# Patient Record
Sex: Female | Born: 1935 | Race: White | Hispanic: No | Smoking: Never smoker
Health system: Southern US, Community
[De-identification: ages and names within clinical notes are randomized; demographics above are authoritative.]

## PROBLEM LIST (undated history)

## (undated) DIAGNOSIS — E785 Hyperlipidemia, unspecified: Secondary | ICD-10-CM

## (undated) DIAGNOSIS — I712 Thoracic aortic aneurysm, without rupture, unspecified: Secondary | ICD-10-CM

## (undated) DIAGNOSIS — I4891 Unspecified atrial fibrillation: Secondary | ICD-10-CM

## (undated) DIAGNOSIS — K635 Polyp of colon: Secondary | ICD-10-CM

## (undated) DIAGNOSIS — E079 Disorder of thyroid, unspecified: Secondary | ICD-10-CM

## (undated) DIAGNOSIS — M1990 Unspecified osteoarthritis, unspecified site: Secondary | ICD-10-CM

## (undated) DIAGNOSIS — I2510 Atherosclerotic heart disease of native coronary artery without angina pectoris: Secondary | ICD-10-CM

## (undated) DIAGNOSIS — M810 Age-related osteoporosis without current pathological fracture: Secondary | ICD-10-CM

## (undated) DIAGNOSIS — N200 Calculus of kidney: Secondary | ICD-10-CM

## (undated) DIAGNOSIS — K279 Peptic ulcer, site unspecified, unspecified as acute or chronic, without hemorrhage or perforation: Secondary | ICD-10-CM

## (undated) HISTORY — DX: Thoracic aortic aneurysm, without rupture, unspecified: I71.20

## (undated) HISTORY — DX: Calculus of kidney: N20.0

## (undated) HISTORY — DX: Hyperlipidemia, unspecified: E78.5

## (undated) HISTORY — PX: CARDIAC VALVE REPLACEMENT: SHX585

## (undated) HISTORY — PX: RENAL ARTERY STENT: SHX2321

## (undated) HISTORY — DX: Polyp of colon: K63.5

## (undated) HISTORY — DX: Age-related osteoporosis without current pathological fracture: M81.0

## (undated) HISTORY — DX: Thoracic aortic aneurysm, without rupture: I71.2

---

## 2003-03-29 HISTORY — PX: AORTIC VALVE REPLACEMENT: SHX41

## 2003-03-29 HISTORY — PX: CORONARY ARTERY BYPASS GRAFT: SHX141

## 2003-04-23 HISTORY — PX: FEMORAL ARTERY STENT: SHX1583

## 2012-02-22 ENCOUNTER — Other Ambulatory Visit: Payer: Self-pay | Admitting: Internal Medicine

## 2012-02-22 DIAGNOSIS — R109 Unspecified abdominal pain: Secondary | ICD-10-CM

## 2012-02-24 ENCOUNTER — Other Ambulatory Visit: Payer: Self-pay | Admitting: Internal Medicine

## 2012-02-24 DIAGNOSIS — N6019 Diffuse cystic mastopathy of unspecified breast: Secondary | ICD-10-CM

## 2012-02-25 ENCOUNTER — Ambulatory Visit
Admission: RE | Admit: 2012-02-25 | Discharge: 2012-02-25 | Disposition: A | Discharge status: Home / Self Care | Payer: Medicare Other | Source: Ambulatory Visit | Attending: Internal Medicine | Admitting: Internal Medicine

## 2012-02-25 DIAGNOSIS — R109 Unspecified abdominal pain: Secondary | ICD-10-CM

## 2012-04-13 ENCOUNTER — Ambulatory Visit
Admission: RE | Admit: 2012-04-13 | Discharge: 2012-04-13 | Disposition: A | Discharge status: Home / Self Care | Payer: Medicare Other | Source: Ambulatory Visit | Attending: Internal Medicine | Admitting: Internal Medicine

## 2012-04-13 ENCOUNTER — Other Ambulatory Visit: Payer: Self-pay | Admitting: Internal Medicine

## 2012-04-13 DIAGNOSIS — N6019 Diffuse cystic mastopathy of unspecified breast: Secondary | ICD-10-CM

## 2012-04-28 ENCOUNTER — Encounter (HOSPITAL_COMMUNITY): Payer: Self-pay | Admitting: Emergency Medicine

## 2012-04-28 ENCOUNTER — Emergency Department (HOSPITAL_COMMUNITY)
Admission: EM | Admit: 2012-04-28 | Discharge: 2012-04-28 | Disposition: A | Discharge status: Home / Self Care | Payer: Medicare Other | Attending: Emergency Medicine | Admitting: Emergency Medicine

## 2012-04-28 ENCOUNTER — Emergency Department (HOSPITAL_COMMUNITY): Payer: Medicare Other

## 2012-04-28 DIAGNOSIS — I4891 Unspecified atrial fibrillation: Secondary | ICD-10-CM | POA: Insufficient documentation

## 2012-04-28 DIAGNOSIS — S0101XA Laceration without foreign body of scalp, initial encounter: Secondary | ICD-10-CM

## 2012-04-28 DIAGNOSIS — Z8739 Personal history of other diseases of the musculoskeletal system and connective tissue: Secondary | ICD-10-CM | POA: Insufficient documentation

## 2012-04-28 DIAGNOSIS — S0990XA Unspecified injury of head, initial encounter: Secondary | ICD-10-CM

## 2012-04-28 DIAGNOSIS — Z7982 Long term (current) use of aspirin: Secondary | ICD-10-CM | POA: Insufficient documentation

## 2012-04-28 DIAGNOSIS — Z79899 Other long term (current) drug therapy: Secondary | ICD-10-CM | POA: Insufficient documentation

## 2012-04-28 DIAGNOSIS — I2510 Atherosclerotic heart disease of native coronary artery without angina pectoris: Secondary | ICD-10-CM | POA: Insufficient documentation

## 2012-04-28 DIAGNOSIS — Y93E5 Activity, floor mopping and cleaning: Secondary | ICD-10-CM | POA: Insufficient documentation

## 2012-04-28 DIAGNOSIS — Z8711 Personal history of peptic ulcer disease: Secondary | ICD-10-CM | POA: Insufficient documentation

## 2012-04-28 DIAGNOSIS — E039 Hypothyroidism, unspecified: Secondary | ICD-10-CM | POA: Insufficient documentation

## 2012-04-28 DIAGNOSIS — Z7901 Long term (current) use of anticoagulants: Secondary | ICD-10-CM | POA: Insufficient documentation

## 2012-04-28 DIAGNOSIS — Y92009 Unspecified place in unspecified non-institutional (private) residence as the place of occurrence of the external cause: Secondary | ICD-10-CM | POA: Insufficient documentation

## 2012-04-28 DIAGNOSIS — W2209XA Striking against other stationary object, initial encounter: Secondary | ICD-10-CM | POA: Insufficient documentation

## 2012-04-28 DIAGNOSIS — S0100XA Unspecified open wound of scalp, initial encounter: Secondary | ICD-10-CM | POA: Insufficient documentation

## 2012-04-28 HISTORY — DX: Unspecified osteoarthritis, unspecified site: M19.90

## 2012-04-28 HISTORY — DX: Peptic ulcer, site unspecified, unspecified as acute or chronic, without hemorrhage or perforation: K27.9

## 2012-04-28 HISTORY — DX: Disorder of thyroid, unspecified: E07.9

## 2012-04-28 HISTORY — DX: Unspecified atrial fibrillation: I48.91

## 2012-04-28 HISTORY — DX: Atherosclerotic heart disease of native coronary artery without angina pectoris: I25.10

## 2012-04-28 LAB — CBC
HCT: 39.9 % (ref 36.0–46.0)
Hemoglobin: 13.4 g/dL (ref 12.0–15.0)
MCHC: 33.6 g/dL (ref 30.0–36.0)
RBC: 4.38 MIL/uL (ref 3.87–5.11)

## 2012-04-28 LAB — PROTIME-INR: INR: 1.99 — ABNORMAL HIGH (ref 0.00–1.49)

## 2012-04-28 NOTE — ED Provider Notes (Signed)
History     CSN: [id number]  Arrival date & time 04/28/12  1256   First MD Initiated Contact with Patient 04/28/12 1300      Chief Complaint  Patient presents with  . Head Laceration    (Consider location/radiation/quality/duration/timing/severity/associated sxs/prior treatment) HPI Pt reports he was dusting in her kitchen earlier today when she stood up and hit her head on the kitchen counter. She reports she was bleeding but denies any LOC. Minimal pain. She has history of atrial fibrillation and is on Coumadin. No bleeding at present. She denies any other injuries.   Past Medical History  Diagnosis Date  . Atrial fibrillation   . PUD (peptic ulcer disease)   . Coronary artery disease   . Thyroid disease     Hypothyroidism  . DJD (degenerative joint disease)   . DJD (degenerative joint disease)     No past surgical history on file.  History reviewed. No pertinent family history.  History  Substance Use Topics  . Smoking status: Unknown If Ever Smoked  . Smokeless tobacco: Not on file  . Alcohol Use: No    OB History    Grav Para Term Preterm Abortions TAB SAB Ect Mult Living                  Review of Systems All other systems reviewed and are negative except as noted in HPI.   Allergies  Review of patient's allergies indicates not on file.  Home Medications  No current outpatient prescriptions on file.  BP 139/76  Pulse 45  Temp 97.7 F (36.5 C) (Oral)  SpO2 98%  Physical Exam  Nursing note and vitals reviewed. Constitutional: She is oriented to person, place, and time. She appears well-developed and well-nourished.  HENT:  Head: Normocephalic.       1cm laceration to L frontal scalp  Eyes: EOM are normal. Pupils are equal, round, and reactive to light.  Neck: Normal range of motion. Neck supple.  Cardiovascular: Normal rate, normal heart sounds and intact distal pulses.   Pulmonary/Chest: Effort normal and breath sounds normal.  Abdominal:  Bowel sounds are normal. She exhibits no distension. There is no tenderness.  Musculoskeletal: Normal range of motion. She exhibits no edema and no tenderness.  Neurological: She is alert and oriented to person, place, and time. She has normal strength. No cranial nerve deficit or sensory deficit.  Skin: Skin is warm and dry. No rash noted.  Psychiatric: She has a normal mood and affect.    ED Course  Procedures (including critical care time)  Labs Reviewed  PROTIME-INR - Abnormal; Notable for the following:    Prothrombin Time 21.8 (*)     INR 1.99 (*)     All other components within normal limits  CBC   Ct Head Wo Contrast  04/28/2012  *RADIOLOGY REPORT*  Clinical Data: Head injury.  On Coumadin  CT HEAD WITHOUT CONTRAST  Technique:  Contiguous axial images were obtained from the base of the skull through the vertex without contrast.  Comparison: None  Findings: Age appropriate atrophy.  Negative for acute infarct. Mild chronic microvascular ischemia in the white matter.  Negative for intracranial hemorrhage.  Negative for mass or edema.  Negative for skull fracture.  IMPRESSION: No acute abnormality.   Original Report Authenticated By: Janeece Riggers, M.D.      No diagnosis found.    MDM  LACERATION REPAIR Performed by: Pollyann Savoy. Consent: Verbal consent obtained. Risks and benefits: risks, benefits  and alternatives were discussed Patient identity confirmed: provided demographic data Time out performed prior to procedure Prepped and Draped in normal sterile fashion Wound explored Laceration Location: L frontal scalp Laceration Length: 1cm No Foreign Bodies seen or palpated Irrigation method: syringe Amount of cleaning: standard Skin closure: staple Number of sutures or staples: 1 Technique: staple Patient tolerance: Patient tolerated the procedure well with no immediate complications.        Charles B. Bernette Mayers, MD 04/28/12 1445

## 2012-04-28 NOTE — ED Notes (Signed)
Dr Sheldon at bedside  

## 2012-04-28 NOTE — ED Notes (Addendum)
Per EMS pt was in her independent living apt and was cleaning, she bent over to pick something up and when she stood up she hit her head on the corner of the counter top. Pt has a cut to the front top of head, visible hematoma.  Pt is on coumadin. Bleeding is controlled. Denies any pain. Daughter at bedside. Vital signs stable.

## 2012-04-28 NOTE — ED Notes (Signed)
Discharge instructions reviewed with pt and daughter. Both verbalized understanding.

## 2012-04-28 NOTE — ED Notes (Signed)
Pt transported to CT ?

## 2012-05-19 ENCOUNTER — Other Ambulatory Visit: Payer: Self-pay | Admitting: *Deleted

## 2012-05-19 ENCOUNTER — Encounter: Payer: Self-pay | Admitting: Vascular Surgery

## 2012-05-19 DIAGNOSIS — I739 Peripheral vascular disease, unspecified: Secondary | ICD-10-CM

## 2012-06-16 ENCOUNTER — Encounter: Payer: Medicare Other | Admitting: Vascular Surgery

## 2012-07-18 ENCOUNTER — Encounter: Payer: Self-pay | Admitting: Vascular Surgery

## 2012-07-19 ENCOUNTER — Ambulatory Visit (INDEPENDENT_AMBULATORY_CARE_PROVIDER_SITE_OTHER): Payer: Medicare Other | Admitting: Vascular Surgery

## 2012-07-19 ENCOUNTER — Encounter (INDEPENDENT_AMBULATORY_CARE_PROVIDER_SITE_OTHER): Payer: Medicare Other | Admitting: *Deleted

## 2012-07-19 ENCOUNTER — Encounter: Payer: Self-pay | Admitting: Vascular Surgery

## 2012-07-19 ENCOUNTER — Encounter: Payer: Medicare Other | Admitting: *Deleted

## 2012-07-19 VITALS — BP 101/72 | HR 48 | Wt 112.1 lb

## 2012-07-19 DIAGNOSIS — I739 Peripheral vascular disease, unspecified: Secondary | ICD-10-CM

## 2012-07-19 DIAGNOSIS — Z48812 Encounter for surgical aftercare following surgery on the circulatory system: Secondary | ICD-10-CM

## 2012-07-19 NOTE — Addendum Note (Signed)
Addended by: Adria Dill L on: 07/19/2012 03:48 PM   Modules accepted: Orders

## 2012-07-19 NOTE — Progress Notes (Signed)
[facility] [place] HISTORY AND PHYSICAL   History of Present Illness:  Patient is a 79 y.o. year old female who presents for evaluation of bilateral LE.  Other medical problems include Right LE occlusions with collateral flow, and Left LE SFA stent with multiple angioplasty procedures.  Her primary care doctor is Nila Nephew.  She has just moved from [place].  Past Medical History  Diagnosis Date  . Atrial fibrillation   . PUD (peptic ulcer disease)   . Coronary artery disease   . Thyroid disease     Hypothyroidism  . DJD (degenerative joint disease)   . DJD (degenerative joint disease)   . Hyperlipidemia   . Osteoporosis   . Thoracic aortic aneurysm   . Colon polyps   . Kidney stone     Past Surgical History  Procedure Laterality Date  . Cardiac valve replacement      aortic valve  . Renal artery stent    . Aortic valve replacement  03/29/2003  . Coronary artery bypass graft  03/29/2003  . Femoral artery stent Left 04/2003     Social History History  Substance Use Topics  . Smoking status: Never Smoker   . Smokeless tobacco: Never Used  . Alcohol Use: No    Family History Family History  Problem Relation Age of Onset  . Heart disease Mother   . Hypertension Mother   . Heart disease Father   . Hypertension Father   . Heart attack Father   . Heart disease Brother   . Hypertension Brother   . Diabetes Daughter     Allergies  Allergies  Allergen Reactions  . Shrimp (Shellfish Allergy) Hives     Current Outpatient Prescriptions  Medication Sig Dispense Refill  . aspirin 81 MG tablet Take 81 mg by mouth every other day.      Marland Kitchen atorvastatin (LIPITOR) 40 MG tablet Take 40 mg by mouth daily.      Marland Kitchen levothyroxine (SYNTHROID, LEVOTHROID) 88 MCG tablet Take 88 mcg by mouth daily.      Marland Kitchen SOTALOL AF 80 MG TABS Take by mouth 2 (two) times daily. Take 1/2 tablet bid      . temazepam (RESTORIL) 15 MG capsule Take 15 mg by mouth at bedtime.       Marland Kitchen warfarin (COUMADIN) 2.5 MG tablet Take 2.5 mg by mouth See admin instructions. Takes 5 mg every day except Tuesday and Saturdays      . warfarin (COUMADIN) 5 MG tablet Take 5 mg by mouth 2 (two) times a week. On Tuesdays and Saturdays       No current facility-administered medications for this visit.    ROS:   General:  No weight loss, Fever, chills  HEENT: No recent headaches, no nasal bleeding, no visual changes, no sore throat  Neurologic: No dizziness, blackouts, seizures. No recent symptoms of stroke or mini- stroke. No recent episodes of slurred speech, or temporary blindness.  Cardiac: No recent episodes of chest pain/pressure, no shortness of breath at rest.  No shortness of breath with exertion.  Denies history of atrial fibrillation or irregular heartbeat History of MI and carotid stenting.  Vascular: No history of rest pain in feet.  No history of claudication.  Positive history of non-healing ulcer, No history of DVT   Pulmonary: No home oxygen, no productive cough, no hemoptysis,  No asthma or wheezing  Musculoskeletal:  [ ]  Arthritis, [x ] Low back pain,  [ ]  Joint pain  Kyphoplasty with multiple compression fractures.  Hematologic:No history of hypercoagulable state.  No history of easy bleeding.  No history of anemia  Gastrointestinal: No hematochezia or melena,  No gastroesophageal reflux, no trouble swallowing  Urinary: [ ]  chronic Kidney disease, [ ]  on HD - [ ]  MWF or [ ]  TTHS, [ ]  Burning with urination, [ ]  Frequent urination, [ ]  Difficulty urinating; Kidney stones with history of hydronephrosis stenting.  Skin: No rashes  Darken skin L>R anterior shins  Psychological: No history of anxiety,  No history of depression   Physical Examination  Filed Vitals:   07/19/12 1353  BP: 101/72  Pulse: 48  Weight: 112 lb 1.6 oz (50.848 kg)  SpO2: 97%    There is no height on file to calculate BMI.  General:  Alert and oriented, no acute distress HEENT:  Normal Neck: No bruit or JVD Pulmonary: Clear to auscultation bilaterally Cardiac: Regular Rate and Rhythm without murmur Abdomen: Soft, non-tender, non-distended, no mass Skin: No rash Extremity Pulses:  2+ radial, brachial, femoral, NON palpable dorsalis pedis, posterior tibial pulses bilaterally Musculoskeletal: No deformity or edema  Neurologic: Upper and lower extremity motor 5/5 and symmetric  DATA: History and exam from Dr. Amanda Cockayne office as well as post surgery follow up note from Dr. Shirlyn Goltz in [place] post angioplasty left LE   ASSESSMENT:  Chronic LE arterial stenosis right greater than left.   She is currently asymptomatic without complaints of claudication with activity or at rest.   PLAN: Follow up in 1 year for repeat ABI's If symptoms occur call sooner.  Thomasena Edis, EMMA [facility] PA-C [facility] [place] Office: [phone]  Agree with above. I'll see her back in one year with follow up ABIs. She knows to call sooner if she has problems.  Waverly Ferrari, MD, FACS [employer] [phone] 07/19/2012

## 2012-09-15 ENCOUNTER — Other Ambulatory Visit: Payer: Self-pay | Admitting: Internal Medicine

## 2012-09-15 ENCOUNTER — Ambulatory Visit
Admission: RE | Admit: 2012-09-15 | Discharge: 2012-09-15 | Disposition: A | Discharge status: Home / Self Care | Payer: Medicare Other | Source: Ambulatory Visit | Attending: Internal Medicine | Admitting: Internal Medicine

## 2012-09-15 DIAGNOSIS — B999 Unspecified infectious disease: Secondary | ICD-10-CM

## 2012-09-18 ENCOUNTER — Other Ambulatory Visit: Payer: Self-pay | Admitting: Internal Medicine

## 2012-09-18 DIAGNOSIS — M810 Age-related osteoporosis without current pathological fracture: Secondary | ICD-10-CM

## 2012-10-06 ENCOUNTER — Ambulatory Visit
Admission: RE | Admit: 2012-10-06 | Discharge: 2012-10-06 | Disposition: A | Discharge status: Home / Self Care | Payer: Medicare Other | Source: Ambulatory Visit | Attending: Internal Medicine | Admitting: Internal Medicine

## 2012-10-06 DIAGNOSIS — M810 Age-related osteoporosis without current pathological fracture: Secondary | ICD-10-CM

## 2013-01-26 ENCOUNTER — Other Ambulatory Visit: Payer: Self-pay | Admitting: Internal Medicine

## 2013-01-26 DIAGNOSIS — R351 Nocturia: Secondary | ICD-10-CM

## 2013-01-30 ENCOUNTER — Other Ambulatory Visit: Payer: Medicare Other

## 2013-01-31 ENCOUNTER — Ambulatory Visit
Admission: RE | Admit: 2013-01-31 | Discharge: 2013-01-31 | Disposition: A | Discharge status: Home / Self Care | Payer: Medicare Other | Source: Ambulatory Visit | Attending: Internal Medicine | Admitting: Internal Medicine

## 2013-01-31 ENCOUNTER — Other Ambulatory Visit: Payer: Self-pay | Admitting: Internal Medicine

## 2013-01-31 DIAGNOSIS — R351 Nocturia: Secondary | ICD-10-CM

## 2013-03-29 ENCOUNTER — Other Ambulatory Visit: Payer: Self-pay

## 2013-03-29 DIAGNOSIS — Z1231 Encounter for screening mammogram for malignant neoplasm of breast: Secondary | ICD-10-CM

## 2013-04-20 ENCOUNTER — Ambulatory Visit
Admission: RE | Admit: 2013-04-20 | Discharge: 2013-04-20 | Disposition: A | Discharge status: Home / Self Care | Payer: Medicare Other | Source: Ambulatory Visit

## 2013-04-20 DIAGNOSIS — Z1231 Encounter for screening mammogram for malignant neoplasm of breast: Secondary | ICD-10-CM

## 2013-07-17 ENCOUNTER — Encounter: Payer: Self-pay | Admitting: Vascular Surgery

## 2013-07-18 ENCOUNTER — Ambulatory Visit (HOSPITAL_COMMUNITY)
Admission: RE | Admit: 2013-07-18 | Discharge: 2013-07-18 | Disposition: A | Discharge status: Home / Self Care | Payer: Medicare Other | Source: Ambulatory Visit | Attending: Family | Admitting: Family

## 2013-07-18 ENCOUNTER — Ambulatory Visit (INDEPENDENT_AMBULATORY_CARE_PROVIDER_SITE_OTHER): Payer: Medicare Other | Admitting: Family

## 2013-07-18 ENCOUNTER — Ambulatory Visit: Payer: Medicare Other | Admitting: Vascular Surgery

## 2013-07-18 ENCOUNTER — Encounter: Payer: Self-pay | Admitting: Family

## 2013-07-18 VITALS — BP 132/80 | HR 68 | Resp 16 | Ht <= 58 in | Wt 111.5 lb

## 2013-07-18 DIAGNOSIS — Z48812 Encounter for surgical aftercare following surgery on the circulatory system: Secondary | ICD-10-CM | POA: Insufficient documentation

## 2013-07-18 DIAGNOSIS — I739 Peripheral vascular disease, unspecified: Secondary | ICD-10-CM

## 2013-07-18 NOTE — Patient Instructions (Signed)
Peripheral Vascular Disease Peripheral Vascular Disease (PVD), also called Peripheral Arterial Disease (PAD), is a circulation problem caused by cholesterol (atherosclerotic plaque) deposits in the arteries. PVD commonly occurs in the lower extremities (legs) but it can occur in other areas of the body, such as your arms. The cholesterol buildup in the arteries reduces blood flow which can cause pain and other serious problems. The presence of PVD can place a person at risk for Coronary Artery Disease (CAD).  CAUSES  Causes of PVD can be many. It is usually associated with more than one risk factor such as:   High Cholesterol.  Smoking.  Diabetes.  Lack of exercise or inactivity.  High blood pressure (hypertension).  Obesity.  Family history. SYMPTOMS   When the lower extremities are affected, patients with PVD may experience:  Leg pain with exertion or physical activity. This is called INTERMITTENT CLAUDICATION. This may present as cramping or numbness with physical activity. The location of the pain is associated with the level of blockage. For example, blockage at the abdominal level (distal abdominal aorta) may result in buttock or hip pain. Lower leg arterial blockage may result in calf pain.  As PVD becomes more severe, pain can develop with less physical activity.  In people with severe PVD, leg pain may occur at rest.  Other PVD signs and symptoms:  Leg numbness or weakness.  Coldness in the affected leg or foot, especially when compared to the other leg.  A change in leg color.  Patients with significant PVD are more prone to ulcers or sores on toes, feet or legs. These may take longer to heal or may reoccur. The ulcers or sores can become infected.  If signs and symptoms of PVD are ignored, gangrene may occur. This can result in the loss of toes or loss of an entire limb.  Not all leg pain is related to PVD. Other medical conditions can cause leg pain such  as:  Blood clots (embolism) or Deep Vein Thrombosis.  Inflammation of the blood vessels (vasculitis).  Spinal stenosis. DIAGNOSIS  Diagnosis of PVD can involve several different types of tests. These can include:  Pulse Volume Recording Method (PVR). This test is simple, painless and does not involve the use of X-rays. PVR involves measuring and comparing the blood pressure in the arms and legs. An ABI (Ankle-Brachial Index) is calculated. The normal ratio of blood pressures is 1. As this number becomes smaller, it indicates more severe disease.  < 0.95  indicates significant narrowing in one or more leg vessels.  <0.8 there will usually be pain in the foot, leg or buttock with exercise.  <0.4 will usually have pain in the legs at rest.  <0.25  usually indicates limb threatening PVD.  Doppler detection of pulses in the legs. This test is painless and checks to see if you have a pulses in your legs/feet.  A dye or contrast material (a substance that highlights the blood vessels so they show up on x-ray) may be given to help your caregiver better see the arteries for the following tests. The dye is eliminated from your body by the kidney's. Your caregiver may order blood work to check your kidney function and other laboratory values before the following tests are performed:  Magnetic Resonance Angiography (MRA). An MRA is a picture study of the blood vessels and arteries. The MRA machine uses a large magnet to produce images of the blood vessels.  Computed Tomography Angiography (CTA). A CTA is a   specialized x-ray that looks at how the blood flows in your blood vessels. An IV may be inserted into your arm so contrast dye can be injected.  Angiogram. Is a procedure that uses x-rays to look at your blood vessels. This procedure is minimally invasive, meaning a small incision (cut) is made in your groin. A small tube (catheter) is then inserted into the artery of your groin. The catheter is  guided to the blood vessel or artery your caregiver wants to examine. Contrast dye is injected into the catheter. X-rays are then taken of the blood vessel or artery. After the images are obtained, the catheter is taken out. TREATMENT  Treatment of PVD involves many interventions which may include:  Lifestyle changes:  Quitting smoking.  Exercise.  Following a low fat, low cholesterol diet.  Control of diabetes.  Foot care is very important to the PVD patient. Good foot care can help prevent infection.  Medication:  Cholesterol-lowering medicine.  Blood pressure medicine.  Anti-platelet drugs.  Certain medicines may reduce symptoms of Intermittent Claudication.  Interventional/Surgical options:  Angioplasty. An Angioplasty is a procedure that inflates a balloon in the blocked artery. This opens the blocked artery to improve blood flow.  Stent Implant. A wire mesh tube (stent) is placed in the artery. The stent expands and stays in place, allowing the artery to remain open.  Peripheral Bypass Surgery. This is a surgical procedure that reroutes the blood around a blocked artery to help improve blood flow. This type of procedure may be performed if Angioplasty or stent implants are not an option. SEEK IMMEDIATE MEDICAL CARE IF:   You develop pain or numbness in your arms or legs.  Your arm or leg turns cold, becomes blue in color.  You develop redness, warmth, swelling and pain in your arms or legs. MAKE SURE YOU:   Understand these instructions.  Will watch your condition.  Will get help right away if you are not doing well or get worse. Document Released: 04/15/2004 Document Revised: 05/31/2011 Document Reviewed: 03/12/2008 [employer] Patient Information 2014 [employer].  

## 2013-07-18 NOTE — Progress Notes (Signed)
[facility] [place] HISTORY AND PHYSICAL -PAD  History of Present Illness [name] is a 80 y.o. female patient of Dr. Scot Dock who is s/p left SFA stent placed in [place] in 04/2003. She presents for evaluation of bilateral LE arterial occlusive disease.  Her back issues limit her walking, she denies cramping in her legs. Pt denies non healing wounds in feet/legs. She takes prednisone recently for polymyalgia rheumatica, but has not taken chronic corticosteroids it seems. About January 2010 she had her last arteriogram with angioplasty in [place], states results were OK.  Pt Diabetic: No Pt smoker: non-smoker  Pt meds include: Statin :Yes, qod Betablocker: Yes ASA: Yes Other anticoagulants/antiplatelets: coumadin, has atrial fib  Past Medical History  Diagnosis Date  . Atrial fibrillation   . PUD (peptic ulcer disease)   . Coronary artery disease   . Thyroid disease     Hypothyroidism  . DJD (degenerative joint disease)   . DJD (degenerative joint disease)   . Hyperlipidemia   . Osteoporosis   . Thoracic aortic aneurysm   . Colon polyps   . Kidney stone     Social History History  Substance Use Topics  . Smoking status: Never Smoker   . Smokeless tobacco: Never Used  . Alcohol Use: No    Family History Family History  Problem Relation Age of Onset  . Heart disease Mother   . Hypertension Mother   . Dementia Mother   . Heart disease Father   . Hypertension Father     Before age 30  . Heart attack Father     X ' s   2-3  . Heart disease Brother   . Hypertension Brother   . Diabetes Daughter     Past Surgical History  Procedure Laterality Date  . Cardiac valve replacement      aortic valve  . Renal artery stent    . Aortic valve replacement  03/29/2003  . Coronary artery bypass graft  03/29/2003  . Femoral artery stent Left 04/2003    Allergies  Allergen Reactions  . Shrimp [Shellfish Allergy] Hives    Current Outpatient  Prescriptions  Medication Sig Dispense Refill  . aspirin 81 MG tablet Take 81 mg by mouth every other day.      . Calcium Carbonate-Vitamin D (CALCIUM 600+D) 600-200 MG-UNIT TABS Take 2 tablets by mouth daily.      Marland Kitchen levothyroxine (SYNTHROID, LEVOTHROID) 88 MCG tablet Take 88 mcg by mouth daily.      . predniSONE (DELTASONE) 5 MG tablet       . SOTALOL AF 80 MG TABS Take by mouth 2 (two) times daily. Take 1/2 tablet bid      . temazepam (RESTORIL) 15 MG capsule Take 15 mg by mouth at bedtime.      Marland Kitchen warfarin (COUMADIN) 2.5 MG tablet Take 2.5 mg by mouth See admin instructions. Takes 5 mg every day except Tuesday and Saturdays      . warfarin (COUMADIN) 5 MG tablet Take 5 mg by mouth 2 (two) times a week. On Tuesdays and Saturdays      . atorvastatin (LIPITOR) 40 MG tablet Take 40 mg by mouth daily.      . fluticasone (FLONASE) 50 MCG/ACT nasal spray       . traMADol (ULTRAM) 50 MG tablet        No current facility-administered medications for this visit.    ROS: See HPI for pertinent positives and negatives.  Physical Examination  Filed Vitals:   07/18/13 1522  BP: 132/80  Pulse: 68  Resp: 16   Filed Weights   07/18/13 1522  Weight: 111 lb 8 oz (50.576 kg)   Body mass index is 23.31 kg/(m^2).  General: A&O x 3, WDWN. Gait: normal Eyes: PERRLA. Pulmonary: CTAB, without wheezes , rales or rhonchi. Cardiac: irregular Rythm , without detected murmur.         Carotid Bruits Left Right   Negative Negative  Aorta is not palpable. Radial pulses: 1+ palpable and =                           VASCULAR EXAM: Extremities without ischemic changes  without Gangrene; without open wounds. Hemosiderin deposits on anterior lower legs, no edema.                                                                                                          LE Pulses LEFT RIGHT       FEMORAL   palpable   palpable        POPLITEAL  not palpable   not palpable       POSTERIOR TIBIAL  not  palpable   not palpable        DORSALIS PEDIS      ANTERIOR TIBIAL faintly palpable  faintly palpable    Abdomen: soft, NT, no masses. Skin: no rashes, no ulcers noted. Musculoskeletal: no muscle wasting or atrophy.  Neurologic: A&O X 3; Appropriate Affect ; SENSATION: normal; MOTOR FUNCTION:  moving all extremities equally, motor strength 4/5 in UE's, 3/5 in LE's. Speech is fluent/normal. CN 2-12 intact.   ASSESSMENT: [name] is a 80 y.o. female who presents s/p left SFA stent placed in [place] in 04/2003. Venous stasis changes in both lower legs without swelling. Her walking is limited by spine issues. TBI's have improved to 0.62 and 0.73, ABI's could not be obtained due to non-compressible vessels. Waveforms are suggest moderate-severe arterial insufficiency in the right LE and minimal insufficiency in the left LE.  PLAN:  Graduated walking program or daily chair exercises for legs as discussed. I discussed in depth with the patient the nature of atherosclerosis, and emphasized the importance of maximal medical management including strict control of blood pressure, blood glucose, and lipid levels, obtaining regular exercise, and continued cessation of smoking.  The patient is aware that without maximal medical management the underlying atherosclerotic disease process will progress, limiting the benefit of any interventions.  Based on the patient's vascular studies and examination, pt will return to clinic in 1 year for ABI's, and Duplex of left LE stent, see Dr. Scot Dock.  The patient was given information about PAD including signs, symptoms, treatment, what symptoms should prompt the patient to seek immediate medical care, and risk reduction measures to take.  [name], RN, MSN, FNP-C [facility] Phone: [phone]  Clinic MD: Scot Dock  07/18/2013 3:35 PM

## 2013-10-08 ENCOUNTER — Emergency Department (HOSPITAL_COMMUNITY): Payer: Medicare Other

## 2013-10-08 ENCOUNTER — Emergency Department (HOSPITAL_COMMUNITY)
Admission: EM | Admit: 2013-10-08 | Discharge: 2013-10-08 | Disposition: A | Discharge status: Home / Self Care | Payer: Medicare Other | Attending: Emergency Medicine | Admitting: Emergency Medicine

## 2013-10-08 ENCOUNTER — Encounter (HOSPITAL_COMMUNITY): Payer: Self-pay | Admitting: Emergency Medicine

## 2013-10-08 DIAGNOSIS — E039 Hypothyroidism, unspecified: Secondary | ICD-10-CM | POA: Insufficient documentation

## 2013-10-08 DIAGNOSIS — IMO0002 Reserved for concepts with insufficient information to code with codable children: Secondary | ICD-10-CM | POA: Insufficient documentation

## 2013-10-08 DIAGNOSIS — Z954 Presence of other heart-valve replacement: Secondary | ICD-10-CM | POA: Insufficient documentation

## 2013-10-08 DIAGNOSIS — I4891 Unspecified atrial fibrillation: Secondary | ICD-10-CM | POA: Insufficient documentation

## 2013-10-08 DIAGNOSIS — Z860100 Personal history of colon polyps, unspecified: Secondary | ICD-10-CM | POA: Insufficient documentation

## 2013-10-08 DIAGNOSIS — M1990 Unspecified osteoarthritis, unspecified site: Secondary | ICD-10-CM | POA: Insufficient documentation

## 2013-10-08 DIAGNOSIS — Z7901 Long term (current) use of anticoagulants: Secondary | ICD-10-CM | POA: Insufficient documentation

## 2013-10-08 DIAGNOSIS — R079 Chest pain, unspecified: Secondary | ICD-10-CM

## 2013-10-08 DIAGNOSIS — Z8711 Personal history of peptic ulcer disease: Secondary | ICD-10-CM | POA: Insufficient documentation

## 2013-10-08 DIAGNOSIS — I712 Thoracic aortic aneurysm, without rupture, unspecified: Secondary | ICD-10-CM | POA: Insufficient documentation

## 2013-10-08 DIAGNOSIS — I10 Essential (primary) hypertension: Secondary | ICD-10-CM | POA: Insufficient documentation

## 2013-10-08 DIAGNOSIS — Z7982 Long term (current) use of aspirin: Secondary | ICD-10-CM | POA: Insufficient documentation

## 2013-10-08 DIAGNOSIS — Z951 Presence of aortocoronary bypass graft: Secondary | ICD-10-CM | POA: Insufficient documentation

## 2013-10-08 DIAGNOSIS — Z8781 Personal history of (healed) traumatic fracture: Secondary | ICD-10-CM | POA: Insufficient documentation

## 2013-10-08 DIAGNOSIS — I2510 Atherosclerotic heart disease of native coronary artery without angina pectoris: Secondary | ICD-10-CM | POA: Insufficient documentation

## 2013-10-08 DIAGNOSIS — Z79899 Other long term (current) drug therapy: Secondary | ICD-10-CM | POA: Insufficient documentation

## 2013-10-08 DIAGNOSIS — Z87442 Personal history of urinary calculi: Secondary | ICD-10-CM | POA: Insufficient documentation

## 2013-10-08 LAB — CBC WITH DIFFERENTIAL/PLATELET
BASOS PCT: 0 % (ref 0–1)
Basophils Absolute: 0 10*3/uL (ref 0.0–0.1)
EOS ABS: 0 10*3/uL (ref 0.0–0.7)
Eosinophils Relative: 0 % (ref 0–5)
HCT: 39.4 % (ref 36.0–46.0)
HEMOGLOBIN: 13.1 g/dL (ref 12.0–15.0)
Lymphocytes Relative: 21 % (ref 12–46)
Lymphs Abs: 2.4 10*3/uL (ref 0.7–4.0)
MCH: 30.9 pg (ref 26.0–34.0)
MCHC: 33.2 g/dL (ref 30.0–36.0)
MCV: 92.9 fL (ref 78.0–100.0)
MONOS PCT: 8 % (ref 3–12)
Monocytes Absolute: 0.9 10*3/uL (ref 0.1–1.0)
NEUTROS ABS: 7.8 10*3/uL — AB (ref 1.7–7.7)
Neutrophils Relative %: 71 % (ref 43–77)
Platelets: 242 10*3/uL (ref 150–400)
RBC: 4.24 MIL/uL (ref 3.87–5.11)
RDW: 15.2 % (ref 11.5–15.5)
WBC: 11 10*3/uL — ABNORMAL HIGH (ref 4.0–10.5)

## 2013-10-08 LAB — TROPONIN I

## 2013-10-08 LAB — BASIC METABOLIC PANEL
ANION GAP: 14 (ref 5–15)
BUN: 15 mg/dL (ref 6–23)
CALCIUM: 8.6 mg/dL (ref 8.4–10.5)
CO2: 26 meq/L (ref 19–32)
CREATININE: 0.54 mg/dL (ref 0.50–1.10)
Chloride: 103 mEq/L (ref 96–112)
GFR calc Af Amer: >90 mL/min (ref >90)
GFR, EST NON AFRICAN AMERICAN: 81 mL/min — AB (ref >90)
Glucose, Bld: 120 mg/dL — ABNORMAL HIGH (ref 70–99)
Potassium: 3.8 mEq/L (ref 3.7–5.3)
Sodium: 143 mEq/L (ref 137–147)

## 2013-10-08 LAB — PROTIME-INR
INR: 3.55 — AB (ref 0.00–1.49)
Prothrombin Time: 35.5 seconds — ABNORMAL HIGH (ref 11.6–15.2)

## 2013-10-08 MED ORDER — IOHEXOL 350 MG/ML SOLN
100.0000 mL | Freq: Once | INTRAVENOUS | Status: AC | PRN
  Administered 2013-10-08: 100 mL via INTRAVENOUS

## 2013-10-08 NOTE — ED Notes (Signed)
Patient from [place]. Woke up with tightness in her back and chest at 2am. Pain has since resolved, but pt checked BP and got 190/110 so patient decided to call EMS and come in and be evaluated. Hx of spinal fracture. Denies pain at this time. Pt has doc appt later today to get coumadin level checked which she takes for her a-fib. Denies n/v, sob, cp, lightheaded/dizziness, diaphoresis.

## 2013-10-08 NOTE — Discharge Instructions (Signed)
Please follow up as scheduled with your doctor today.  Return to the ER for worsening condition or new concerning symptoms.   Hypertension Hypertension, commonly called high blood pressure, is when the force of blood pumping through your arteries is too strong. Your arteries are the blood vessels that carry blood from your heart throughout your body. A blood pressure reading consists of a higher number over a lower number, such as 110/72. The higher number (systolic) is the pressure inside your arteries when your heart pumps. The lower number (diastolic) is the pressure inside your arteries when your heart relaxes. Ideally you want your blood pressure below 120/80. Hypertension forces your heart to work harder to pump blood. Your arteries may become narrow or stiff. Having hypertension puts you at risk for heart disease, stroke, and other problems.  RISK FACTORS Some risk factors for high blood pressure are controllable. Others are not.  Risk factors you cannot control include:   Race. You may be at higher risk if you are African American.  Age. Risk increases with age.  Gender. Men are at higher risk than women before age 62 years. After age 26, women are at higher risk than men. Risk factors you can control include:  Not getting enough exercise or physical activity.  Being overweight.  Getting too much fat, sugar, calories, or salt in your diet.  Drinking too much alcohol. SIGNS AND SYMPTOMS Hypertension does not usually cause signs or symptoms. Extremely high blood pressure (hypertensive crisis) may cause headache, anxiety, shortness of breath, and nosebleed. DIAGNOSIS  To check if you have hypertension, your health care provider will measure your blood pressure while you are seated, with your arm held at the level of your heart. It should be measured at least twice using the same arm. Certain conditions can cause a difference in blood pressure between your right and left arms. A blood  pressure reading that is higher than normal on one occasion does not mean that you need treatment. If one blood pressure reading is high, ask your health care provider about having it checked again. TREATMENT  Treating high blood pressure includes making lifestyle changes and possibly taking medication. Living a healthy lifestyle can help lower high blood pressure. You may need to change some of your habits. Lifestyle changes may include:  Following the DASH diet. This diet is high in fruits, vegetables, and whole grains. It is low in salt, red meat, and added sugars.  Getting at least 2 1/2 hours of brisk physical activity every week.  Losing weight if necessary.  Not smoking.  Limiting alcoholic beverages.  Learning ways to reduce stress. If lifestyle changes are not enough to get your blood pressure under control, your health care provider may prescribe medicine. You may need to take more than one. Work closely with your health care provider to understand the risks and benefits. HOME CARE INSTRUCTIONS  Have your blood pressure rechecked as directed by your health care provider.   Only take medicine as directed by your health care provider. Follow the directions carefully. Blood pressure medicines must be taken as prescribed. The medicine does not work as well when you skip doses. Skipping doses also puts you at risk for problems.   Do not smoke.   Monitor your blood pressure at home as directed by your health care provider. SEEK MEDICAL CARE IF:   You think you are having a reaction to medicines taken.  You have recurrent headaches or feel dizzy.  You have  swelling in your ankles.  You have trouble with your vision. SEEK IMMEDIATE MEDICAL CARE IF:  You develop a severe headache or confusion.  You have unusual weakness, numbness, or feel faint.  You have severe chest or abdominal pain.  You vomit repeatedly.  You have trouble breathing. MAKE SURE YOU:    Understand these instructions.  Will watch your condition.  Will get help right away if you are not doing well or get worse. Document Released: 03/08/2005 Document Revised: 03/13/2013 Document Reviewed: 12/29/2012 [facility] Patient Information 2015 [place], [place]. This information is not intended to replace advice given to you by your health care provider. Make sure you discuss any questions you have with your health care provider.  Chest Pain Observation It is often hard to give a specific diagnosis for the cause of chest pain. Among other possibilities your symptoms might be caused by inadequate oxygen delivery to your heart (angina). Angina that is not treated or evaluated can lead to a heart attack (myocardial infarction) or death. Blood tests, electrocardiograms, and X-rays may have been done to help determine a possible cause of your chest pain. After evaluation and observation, your health care provider has determined that it is unlikely your pain was caused by an unstable condition that requires hospitalization. However, a full evaluation of your pain may need to be completed, with additional diagnostic testing as directed. It is very important to keep your follow-up appointments. Not keeping your follow-up appointments could result in permanent heart damage, disability, or death. If there is any problem keeping your follow-up appointments, you must call your health care provider. HOME CARE INSTRUCTIONS  Due to the slight chance that your pain could be angina, it is important to follow your health care provider's treatment plan and also maintain a healthy lifestyle:  Maintain or work toward achieving a healthy weight.  Stay physically active and exercise regularly.  Decrease your salt intake.  Eat a balanced, healthy diet. Talk to a dietitian to learn about heart-healthy foods.  Increase your fiber intake by including whole grains, vegetables, fruits, and nuts in your diet.  Avoid  situations that cause stress, anger, or depression.  Take medicines as advised by your health care provider. Report any side effects to your health care provider. Do not stop medicines or adjust the dosages on your own.  Quit smoking. Do not use nicotine patches or gum until you check with your health care provider.  Keep your blood pressure, blood sugar, and cholesterol levels within normal limits.  Limit alcohol intake to no more than 1 drink per day for women who are not pregnant and 2 drinks per day for men.  Do not abuse drugs. SEEK IMMEDIATE MEDICAL CARE IF: You have severe chest pain or pressure which may include symptoms such as:  You feel pain or pressure in your arms, neck, jaw, or back.  You have severe back or abdominal pain, feel sick to your stomach (nauseous), or throw up (vomit).  You are sweating profusely.  You are having a fast or irregular heartbeat.  You feel short of breath while at rest.  You notice increasing shortness of breath during rest, sleep, or with activity.  You have chest pain that does not get better after rest or after taking your usual medicine.  You wake from sleep with chest pain.  You are unable to sleep because you cannot breathe.  You develop a frequent cough or you are coughing up blood.  You feel dizzy, faint, or  experience extreme fatigue.  You develop severe weakness, dizziness, fainting, or chills. Any of these symptoms may represent a serious problem that is an emergency. Do not wait to see if the symptoms will go away. Call your local emergency services (911 in the [place] Do not drive yourself to the hospital. MAKE SURE YOU:  Understand these instructions.  Will watch your condition.  Will get help right away if you are not doing well or get worse. Document Released: 04/10/2010 Document Revised: 03/13/2013 Document Reviewed: 09/07/2012 [facility] Patient Information 2015 [place], [place]. This information is not intended to  replace advice given to you by your health care provider. Make sure you discuss any questions you have with your health care provider.

## 2013-10-08 NOTE — ED Provider Notes (Signed)
CSN: [id number]     Arrival date & time 10/08/13  0419 History   First MD Initiated Contact with Patient 10/08/13 0501     Chief Complaint  Patient presents with  . Back Pain  . Hypertension     (Consider location/radiation/quality/duration/timing/severity/associated sxs/prior Treatment) HPI 80 year old female presents emergency apartment with complaint of high blood pressure and chest pain.  Patient reports she woke around 3 AM with a band of pain across her chest.  She denies any diaphoresis shortness of breath or nausea associated with the pain.  Pain was sharp in nature.  Pain resolved after an hour on its own.  She was evaluated by EMS on scene, and was noted to be hypertensive.  Patient denies prior history of hypertension.  She reports all symptoms have since resolved.  Patient has history of atrial fibrillation, osteoporosis, previous kyphoplasty for compression fracture of the spine peripheral artery disease, coronary disease, and thoracic aortic aneurysm.  Patient had not been told before about her thoracic aortic aneurysm.  She is unsure if anyone is currently following her for that.  She reports that from time to time she will have similar chest pain that wakes her from sleep.  She denies any recent trauma, no fever chills cough. Past Medical History  Diagnosis Date  . Atrial fibrillation   . PUD (peptic ulcer disease)   . Coronary artery disease   . Thyroid disease     Hypothyroidism  . DJD (degenerative joint disease)   . DJD (degenerative joint disease)   . Hyperlipidemia   . Osteoporosis   . Thoracic aortic aneurysm   . Colon polyps   . Kidney stone    Past Surgical History  Procedure Laterality Date  . Cardiac valve replacement      aortic valve  . Renal artery stent    . Aortic valve replacement  03/29/2003  . Coronary artery bypass graft  03/29/2003  . Femoral artery stent Left 04/2003   Family History  Problem Relation Age of Onset  . Heart disease Mother    . Hypertension Mother   . Dementia Mother   . Heart disease Father   . Hypertension Father     Before age 78  . Heart attack Father     X ' s   2-3  . Heart disease Brother   . Hypertension Brother   . Diabetes Daughter    History  Substance Use Topics  . Smoking status: Never Smoker   . Smokeless tobacco: Never Used  . Alcohol Use: No   OB History   Grav Para Term Preterm Abortions TAB SAB Ect Mult Living                 Review of Systems  See History of Present Illness; otherwise all other systems are reviewed and negative   Allergies  Shrimp  Home Medications   Prior to Admission medications   Medication Sig Start Date End Date Taking? Authorizing Provider  aspirin 81 MG tablet Take 81 mg by mouth every other day.   Yes Historical Provider, MD  Calcium Carbonate-Vitamin D (CALCIUM 600+D) 600-200 MG-UNIT TABS Take 2 tablets by mouth daily.   Yes Historical Provider, MD  chlorzoxazone (PARAFON) 500 MG tablet Take 250 mg by mouth 2 (two) times daily as needed for muscle spasms.   Yes Historical Provider, MD  levothyroxine (SYNTHROID, LEVOTHROID) 88 MCG tablet Take 88 mcg by mouth daily.   Yes Historical Provider, MD  predniSONE (DELTASONE) 5  MG tablet Take 5 mg by mouth 2 (two) times daily with a meal.   Yes Historical Provider, MD  sotalol (BETAPACE) 80 MG tablet Take 40 mg by mouth 2 (two) times daily.   Yes Historical Provider, MD  temazepam (RESTORIL) 15 MG capsule Take 15 mg by mouth at bedtime.   Yes Historical Provider, MD  warfarin (COUMADIN) 2.5 MG tablet Take 2.5 mg by mouth See admin instructions. Take on Tuesday, Wednesday, Friday, Saturday and Sunday.   Yes Historical Provider, MD  warfarin (COUMADIN) 5 MG tablet Take 5 mg by mouth 2 (two) times a week. Take on Monday and Thursday   Yes Historical Provider, MD   BP 169/94  Pulse 58  Temp(Src) 98.4 F (36.9 C) (Oral)  Resp 15  SpO2 99% Physical Exam  Nursing note and vitals reviewed. Constitutional:  She is oriented to person, place, and time. She appears well-developed and well-nourished.  HENT:  Head: Normocephalic and atraumatic.  Nose: Nose normal.  Mouth/Throat: Oropharynx is clear and moist.  Eyes: Conjunctivae and EOM are normal. Pupils are equal, round, and reactive to light.  Neck: Normal range of motion. Neck supple. No JVD present. No tracheal deviation present. No thyromegaly present.  Cardiovascular: Normal rate, regular rhythm, normal heart sounds and intact distal pulses.  Exam reveals no gallop and no friction rub.   No murmur heard. Pulmonary/Chest: Effort normal and breath sounds normal. No stridor. No respiratory distress. She has no wheezes. She has no rales. She exhibits no tenderness.  Abdominal: Soft. Bowel sounds are normal. She exhibits no distension and no mass. There is no tenderness. There is no rebound and no guarding.  Musculoskeletal: Normal range of motion. She exhibits no edema and no tenderness.  Lymphadenopathy:    She has no cervical adenopathy.  Neurological: She is alert and oriented to person, place, and time. She has normal reflexes. She exhibits normal muscle tone. Coordination normal.  Skin: Skin is warm and dry. No rash noted. No erythema. No pallor.  Psychiatric: She has a normal mood and affect. Her behavior is normal. Judgment and thought content normal.    ED Course  Procedures (including critical care time) Labs Review Labs Reviewed  PROTIME-INR - Abnormal; Notable for the following:    Prothrombin Time 35.5 (*)    INR 3.55 (*)    All other components within normal limits  BASIC METABOLIC PANEL - Abnormal; Notable for the following:    Glucose, Bld 120 (*)    GFR calc non Af Amer 81 (*)    All other components within normal limits  CBC WITH DIFFERENTIAL - Abnormal; Notable for the following:    WBC 11.0 (*)    Neutro Abs 7.8 (*)    All other components within normal limits  TROPONIN I    Imaging Review Dg Chest 2  View  10/08/2013   CLINICAL DATA:  Chest pain.  EXAM: CHEST  2 VIEW  COMPARISON:  None.  FINDINGS: The lungs are well-aerated. Mild vascular congestion is noted. No pleural effusion or pneumothorax is seen.  The heart is borderline enlarged. The patient is status post median sternotomy. An aortic valve replacement is noted. No acute osseous abnormalities are seen.  IMPRESSION: Borderline cardiomegaly and mild vascular congestion; lungs remain grossly clear.   Electronically Signed   By: Garald Balding M.D.   On: 10/08/2013 05:47   Ct Angio Chest Pe W/cm &/or Wo Cm  10/08/2013   CLINICAL DATA:  Chest and back pain.  New hypertension. History of thoracic aortic aneurysm.  EXAM: CT ANGIOGRAPHY CHEST, ABDOMEN AND PELVIS  TECHNIQUE: Multidetector CT imaging through the chest, abdomen and pelvis was performed using the standard protocol during bolus administration of intravenous contrast. Multiplanar reconstructed images and MIPs were obtained and reviewed to evaluate the vascular anatomy.  CONTRAST:  187mL OMNIPAQUE IOHEXOL 350 MG/ML SOLN  COMPARISON:  Chest radiograph of 10/08/2013. No prior CT. Renal ultrasound 01/31/2013  FINDINGS: CTA CHEST FINDINGS  Lungs/Pleura: Mild motion degradation inferiorly. No lobar consolidation.  Heart/Mediastinum: Normal appearance of the great vessels, without significant stenosis or aneurysm.  Tortuosity and dilatation Of the ascending aorta. Based on transverse imaging, maximally 5.2 x 4.6 cm on image 68/ series 501. Based on coronal imaging, the aorta measures 2.9 cm at the sino-tubular junction, 3.9 cm just above the sino-tubular junction, and 3.6 cm just inferior to the great vessel origins. No dissection. Normal caliber of transverse and descending segments. Relatively mild for age atherosclerosis.  Moderate cardiomegaly with prior aortic valve repair. Good pulmonary artery opacification, without evidence of pulmonary embolism on this nondedicated study.  No mediastinal or hilar  adenopathy.  CTA ABDOMEN AND PELVIS FINDINGS  Abdomen/Pelvis: Hepatomegaly, 20 cm craniocaudal. No focal liver lesion.  Normal spleen. Mild motion degradation in the upper abdomen. Underdistended proximal stomach. Descending duodenal diverticulum. Mild pancreatic ductal dilatation, including at 4 mm within the pancreatic body. No cause identified.  Normal gallbladder, without biliary ductal dilatation. Suspect pancreas divisum.  Normal adrenal glands and left kidney. A stone within the right renal pelvis measures 9 mm.  Superior mesenteric artery and celiac origin widely patent. Single widely patent renal arteries bilaterally. Moderate aortic atherosclerosis. Patent inferior mesenteric artery. No retroperitoneal or retrocrural adenopathy. Scattered colonic diverticula. Normal terminal ileum. Normal small bowel without abdominal ascites.  No pelvic adenopathy. Pelvic floor laxity. Otherwise normal urinary bladder. Calcifications in the uterus are likely due to small dystrophic fibroids. No adnexal mass or significant free fluid.  Bones/Musculoskeletal: Moderate osteopenia sclerosis and lower right ribs, likely due to and nonacute trauma. S-shaped thoracolumbar spine curvature. Status post vertebral augmentation at L1. Moderate L4, mild L2, mild T12, and moderate T8 compression deformities. The only significant ventral canal encroachment is at the L1 level.  Review of the MIP images confirms the above findings.  IMPRESSION: 1. Ascending aortic tortuosity with mild aneurysmal dilatation. No dissection or other acute complication. 2. Osteopenia with thoracolumbar compression deformities, of indeterminate acuity. 3. Hepatomegaly. 4. Pancreas divisum with mild pancreatic duct dilatation. No evidence of obstructive mass. 5. 9 mm stone in the right renal pelvis. 6. Uterine fibroids.   Electronically Signed   By: Abigail Miyamoto M.D.   On: 10/08/2013 07:34     EKG Interpretation   Date/Time:  Monday October 08 2013 05:30:53  EDT Ventricular Rate:  67 PR Interval:  203 QRS Duration: 96 QT Interval:  468 QTC Calculation: 494 R Axis:   -43 Text Interpretation:  Sinus arrhythmia Left anterior fascicular block RSR'  in V1 or V2, right VCD or RVH Left ventricular hypertrophy Borderline  prolonged QT interval No old tracing to compare Confirmed by Kevonna Nolte  MD,  Taeden Geller ([id number]) on 10/08/2013 5:42:00 AM      MDM   Final diagnoses:  None   80 year old female with an hour of chest pain tonight followed by hypertension.  Her blood pressure is slowly improving on its own.  We'll get blood work and chest x-ray.  Will continue to monitor.  She has followup this  afternoon with her primary care Dr.    Kalman Drape, MD 10/08/13 [phone]

## 2013-10-31 ENCOUNTER — Other Ambulatory Visit: Payer: Self-pay | Admitting: Internal Medicine

## 2013-10-31 ENCOUNTER — Ambulatory Visit
Admission: RE | Admit: 2013-10-31 | Discharge: 2013-10-31 | Disposition: A | Discharge status: Home / Self Care | Payer: Medicare Other | Source: Ambulatory Visit | Attending: Internal Medicine | Admitting: Internal Medicine

## 2013-10-31 DIAGNOSIS — M549 Dorsalgia, unspecified: Secondary | ICD-10-CM

## 2013-11-15 ENCOUNTER — Encounter: Payer: Self-pay | Admitting: Cardiology

## 2013-11-15 ENCOUNTER — Ambulatory Visit (INDEPENDENT_AMBULATORY_CARE_PROVIDER_SITE_OTHER): Payer: Medicare Other | Admitting: Cardiology

## 2013-11-15 VITALS — BP 110/70 | HR 73 | Ht 58.5 in | Wt 110.0 lb

## 2013-11-15 DIAGNOSIS — R0989 Other specified symptoms and signs involving the circulatory and respiratory systems: Principal | ICD-10-CM

## 2013-11-15 DIAGNOSIS — I77810 Thoracic aortic ectasia: Secondary | ICD-10-CM

## 2013-11-15 DIAGNOSIS — Z953 Presence of xenogenic heart valve: Secondary | ICD-10-CM | POA: Insufficient documentation

## 2013-11-15 DIAGNOSIS — I480 Paroxysmal atrial fibrillation: Secondary | ICD-10-CM | POA: Insufficient documentation

## 2013-11-15 DIAGNOSIS — I4891 Unspecified atrial fibrillation: Secondary | ICD-10-CM

## 2013-11-15 DIAGNOSIS — Z952 Presence of prosthetic heart valve: Secondary | ICD-10-CM

## 2013-11-15 DIAGNOSIS — R0609 Other forms of dyspnea: Secondary | ICD-10-CM | POA: Insufficient documentation

## 2013-11-15 DIAGNOSIS — I2510 Atherosclerotic heart disease of native coronary artery without angina pectoris: Secondary | ICD-10-CM

## 2013-11-15 DIAGNOSIS — I700 Atherosclerosis of aorta: Secondary | ICD-10-CM

## 2013-11-15 NOTE — Progress Notes (Signed)
[place]. [address], [address] [place], [place]  [address] Phone: [phone] Fax:  [phone]  Date:  11/15/2013   ID:  [name], DOB 09-03-1922, MRN [id number]  PCP:  Criselda Peaches, MD   History of Present Illness: [name] is a 80 y.o. female here to [facility] with cardiology.  In July of 2005 she underwent aortic valve replacement with single vessel bypass in [place]. Bioprosthetic aortic valve. She also has peripheral vascular disease with left SFA stent placed in [place] in 2005. She is currently following with Dr. Scot Dock.   Several years ago she was diagnosed with atrial fibrillation. She is currently on chronic anticoagulation followed by Dr. Nyoka Cowden. She has been maintained on sotalol currently very low-dose 40 mg twice a day. QT interval normal.  Last EKG demonstrated sinus rhythm.  Over the last several months, she had developed lower extremity edema, bulla formation with weeping. She is currently wearing her compression hose that are 71 years old from her previous surgery. Daughter is friends with Dr. Ron Parker, she is wife of local retired Paediatric nurse.   Last EKG demonstrated sinus rhythm with rare PAC. 10/08/2013.  Mild increased SOB, not debilitating but noticeable by her family members.. Had high location chest wall sharp chest pain, GERD? Burp, went away.   PMR - prednisode. Also has compression fractures of vertebral bodies. Chronic back pain.   Wt Readings from Last 3 Encounters:  11/15/13 110 lb (49.896 kg)  07/18/13 111 lb 8 oz (50.576 kg)  07/19/12 112 lb 1.6 oz (50.848 kg)     Past Medical History  Diagnosis Date  . Atrial fibrillation   . PUD (peptic ulcer disease)   . Coronary artery disease   . Thyroid disease     Hypothyroidism  . DJD (degenerative joint disease)   . DJD (degenerative joint disease)   . Hyperlipidemia   . Osteoporosis   . Thoracic aortic aneurysm   . Colon polyps   . Kidney stone     Past Surgical History    Procedure Laterality Date  . Cardiac valve replacement      aortic valve  . Renal artery stent    . Aortic valve replacement  03/29/2003  . Coronary artery bypass graft  03/29/2003  . Femoral artery stent Left 04/2003    Current Outpatient Prescriptions  Medication Sig Dispense Refill  . aspirin 81 MG tablet Take 81 mg by mouth every other day.      . Calcium Carbonate-Vitamin D (CALCIUM 600+D) 600-200 MG-UNIT TABS Take 2 tablets by mouth daily.      . chlorzoxazone (PARAFON) 500 MG tablet Take 250 mg by mouth 2 (two) times daily as needed for muscle spasms.      . furosemide (LASIX) 20 MG tablet Take 40 mg by mouth daily.      Marland Kitchen HYDROcodone-acetaminophen (NORCO/VICODIN) 5-325 MG per tablet       . levothyroxine (SYNTHROID, LEVOTHROID) 88 MCG tablet Take 88 mcg by mouth daily.      . predniSONE (DELTASONE) 5 MG tablet Take 5 mg by mouth 2 (two) times daily with a meal.      . sotalol (BETAPACE) 80 MG tablet Take 40 mg by mouth 2 (two) times daily.      . temazepam (RESTORIL) 15 MG capsule Take 15 mg by mouth at bedtime.      Marland Kitchen warfarin (COUMADIN) 2.5 MG tablet Take 2.5 mg by mouth See admin instructions. Take on Tuesday, Wednesday,  Friday, Saturday and Sunday.      . warfarin (COUMADIN) 5 MG tablet Take 5 mg by mouth 2 (two) times a week. Take on Monday and Thursday       No current facility-administered medications for this visit.    Allergies:    Allergies  Allergen Reactions  . Shrimp [Shellfish Allergy] Hives    Social History:  The patient  reports that she has never smoked. She has never used smokeless tobacco. She reports that she does not drink alcohol or use illicit drugs.   Family History  Problem Relation Age of Onset  . Heart disease Mother   . Hypertension Mother   . Dementia Mother   . Heart disease Father   . Hypertension Father     Before age 48  . Heart attack Father     X ' s   2-3  . Heart disease Brother   . Hypertension Brother   . Diabetes  Daughter     ROS:  Please see the history of present illness.   Denies any syncope, bleeding, orthopnea, PND. Positive for weeping lower extremities, improving. Occasional excoriations on skin.  All other systems reviewed and negative.   PHYSICAL EXAM: VS:  BP 110/70  Pulse 73  Ht 4' 10.5" (1.486 m)  Wt 110 lb (49.896 kg)  BMI 22.60 kg/m2 Well nourished, well developed, in no acute distress HEENT: normal, [place]/AT, EOMI Neck: no JVD, normal carotid upstroke, no bruit Cardiac:  normal S1, S2; RRR; soft systolic right upper sternal border murmurPrior chest wall scar Lungs:  clear to auscultation bilaterally, no wheezing, rhonchi or rales Abd: soft, nontender, no hepatomegaly, no bruits Ext: no edema, 2+ distal pulses Skin: warm and dry GU: deferred Neuro: no focal abnormalities noted, AAO x 3  EKG:  09/08/13-sinus rhythm, 67, PAC, QT interval 468 ms. Labs: Creatinine 0.54, potassium 3.8, hemoglobin 13.1, INR 3.5     ASSESSMENT AND PLAN:  1. Weeping lower extremities-I will check an echocardiogram to ensure proper structure and function. Agree with continued Lasix, compression hose, salt limitation, fluid limitation, raising of lower extremities. 2. Bioprosthetic aortic valve secondary to aortic valve stenosis-checking an echocardiogram. Dental antibiotic prophylaxis. 3. Paroxysmal atrial fibrillation-currently sinus rhythm, low dose sotalol. We discussed the possibility of every 24 hours dosing however she is doing very well on very low dose split dosing at 40 mg twice a day. We will continue. 4. Dilated aortic root, ascending, 5.2 x 4.6 cm on CT scan. Measures 2.9 cm at the sinotubular junction we will continue to monitor. Would be high risk for surgical procedure. No evidence of dissection. 5. Coronary artery disease-single vessel bypass performed in 2005. Stable. 6. Aortic atherosclerosis-seen on CT scan 7. I will see her back in 3 months.  Signed, Candee Furbish, MD [facility]  11/15/2013  3:45 PM

## 2013-11-15 NOTE — Patient Instructions (Signed)
The current medical regimen is effective;  continue present plan and medications.  Your physician has requested that you have an echocardiogram. Echocardiography is a painless test that uses sound waves to create images of your heart. It provides your doctor with information about the size and shape of your heart and how well your heart's chambers and valves are working. This procedure takes approximately one hour. There are no restrictions for this procedure.  Follow up in 3 months with Dr Marlou Porch.

## 2013-12-05 ENCOUNTER — Ambulatory Visit (INDEPENDENT_AMBULATORY_CARE_PROVIDER_SITE_OTHER)
Admission: RE | Admit: 2013-12-05 | Discharge: 2013-12-05 | Disposition: A | Discharge status: Home / Self Care | Payer: Medicare Other | Source: Ambulatory Visit | Attending: Vascular Surgery | Admitting: Vascular Surgery

## 2013-12-05 ENCOUNTER — Ambulatory Visit (HOSPITAL_COMMUNITY)
Admission: RE | Admit: 2013-12-05 | Discharge: 2013-12-05 | Disposition: A | Discharge status: Home / Self Care | Payer: Medicare Other | Source: Ambulatory Visit | Attending: Vascular Surgery | Admitting: Vascular Surgery

## 2013-12-05 DIAGNOSIS — I739 Peripheral vascular disease, unspecified: Secondary | ICD-10-CM

## 2013-12-05 DIAGNOSIS — Z48812 Encounter for surgical aftercare following surgery on the circulatory system: Secondary | ICD-10-CM

## 2013-12-06 ENCOUNTER — Ambulatory Visit (HOSPITAL_COMMUNITY): Payer: Medicare Other | Attending: Cardiology | Admitting: Radiology

## 2013-12-06 DIAGNOSIS — R0609 Other forms of dyspnea: Secondary | ICD-10-CM

## 2013-12-06 DIAGNOSIS — R0989 Other specified symptoms and signs involving the circulatory and respiratory systems: Secondary | ICD-10-CM

## 2013-12-06 DIAGNOSIS — I2510 Atherosclerotic heart disease of native coronary artery without angina pectoris: Secondary | ICD-10-CM | POA: Diagnosis not present

## 2013-12-06 DIAGNOSIS — I4891 Unspecified atrial fibrillation: Secondary | ICD-10-CM | POA: Insufficient documentation

## 2013-12-06 DIAGNOSIS — I739 Peripheral vascular disease, unspecified: Secondary | ICD-10-CM | POA: Insufficient documentation

## 2013-12-06 DIAGNOSIS — E039 Hypothyroidism, unspecified: Secondary | ICD-10-CM | POA: Insufficient documentation

## 2013-12-06 DIAGNOSIS — I359 Nonrheumatic aortic valve disorder, unspecified: Secondary | ICD-10-CM | POA: Insufficient documentation

## 2013-12-06 NOTE — Progress Notes (Signed)
Echocardiogram performed.  

## 2013-12-11 ENCOUNTER — Encounter: Payer: Self-pay | Admitting: Vascular Surgery

## 2013-12-12 ENCOUNTER — Ambulatory Visit (INDEPENDENT_AMBULATORY_CARE_PROVIDER_SITE_OTHER): Payer: Medicare Other | Admitting: Vascular Surgery

## 2013-12-12 ENCOUNTER — Encounter: Payer: Self-pay | Admitting: Vascular Surgery

## 2013-12-12 VITALS — BP 86/65 | HR 81 | Ht 58.5 in | Wt 114.7 lb

## 2013-12-12 DIAGNOSIS — I7025 Atherosclerosis of native arteries of other extremities with ulceration: Secondary | ICD-10-CM | POA: Insufficient documentation

## 2013-12-12 DIAGNOSIS — I739 Peripheral vascular disease, unspecified: Secondary | ICD-10-CM

## 2013-12-12 DIAGNOSIS — Z48812 Encounter for surgical aftercare following surgery on the circulatory system: Secondary | ICD-10-CM

## 2013-12-12 DIAGNOSIS — L98499 Non-pressure chronic ulcer of skin of other sites with unspecified severity: Principal | ICD-10-CM

## 2013-12-12 NOTE — Progress Notes (Signed)
[facility] [place] [place]  Patient name: [name] MRN: [id number] DOB: May 06, 1922 Sex: female  REASON FOR VISIT: Follow up of peripheral vascular disease.  HPI: [name] is a 80 y.o. female who was last seen in our office by Suzann Nickel on 07/18/2013. She has had a previous left SFA stent placed in [place] in 2005. She had studies done in our vascular lab on 12/05/2013. She was set up for an appointment today to discuss those results.  Sent she was seen last, she has had significant leg swelling and blisters. She has also developed a wound on the posterior aspect of her left leg. Directed he is very limited and I do not get any history of claudication. She has severe disc disease in her back which limits her activity. She denies any history of rest pain.  She has undergone previous aortic valve replacement and is on Coumadin. She is followed by Dr. Candee Furbish.  Past Medical History  Diagnosis Date  . Atrial fibrillation   . PUD (peptic ulcer disease)   . Coronary artery disease   . Thyroid disease     Hypothyroidism  . DJD (degenerative joint disease)   . DJD (degenerative joint disease)   . Hyperlipidemia   . Osteoporosis   . Thoracic aortic aneurysm   . Colon polyps   . Kidney stone    Family History  Problem Relation Age of Onset  . Heart disease Mother   . Hypertension Mother   . Dementia Mother   . Heart disease Father   . Hypertension Father     Before age 23  . Heart attack Father     X ' s   2-3  . Heart disease Brother   . Hypertension Brother   . Diabetes Daughter    SOCIAL HISTORY: History  Substance Use Topics  . Smoking status: Never Smoker   . Smokeless tobacco: Never Used  . Alcohol Use: No   Allergies  Allergen Reactions  . Shrimp [Shellfish Allergy] Hives   Current Outpatient Prescriptions  Medication Sig Dispense Refill  . aspirin 81 MG tablet Take 81 mg by mouth every other day.      . Calcium Carbonate-Vitamin D  (CALCIUM 600+D) 600-200 MG-UNIT TABS Take 2 tablets by mouth daily.      . chlorzoxazone (PARAFON) 500 MG tablet Take 250 mg by mouth 3 (three) times daily.       Mariane Baumgarten Calcium (STOOL SOFTENER PO) Take by mouth as needed.      . furosemide (LASIX) 20 MG tablet Take 40 mg by mouth 2 (two) times daily.       Marland Kitchen HYDROcodone-acetaminophen (NORCO/VICODIN) 5-325 MG per tablet       . levothyroxine (SYNTHROID, LEVOTHROID) 88 MCG tablet Take 88 mcg by mouth daily.      . predniSONE (DELTASONE) 5 MG tablet Take 5 mg by mouth 2 (two) times daily with a meal.      . SOTALOL AF 80 MG TABS Take 0.5 tablets by mouth 2 (two) times daily.      . temazepam (RESTORIL) 15 MG capsule Take 15 mg by mouth at bedtime.      Marland Kitchen warfarin (COUMADIN) 2.5 MG tablet Take 2.5 mg by mouth See admin instructions. Take on Tuesday, Wednesday, Friday, Saturday and Sunday.      . sotalol (BETAPACE) 80 MG tablet Take 40 mg by mouth 2 (two) times daily.      Marland Kitchen warfarin (COUMADIN) 5  MG tablet Take 5 mg by mouth 2 (two) times a week. Take on Monday and Thursday       No current facility-administered medications for this visit.   REVIEW OF SYSTEMS: Valu.Nieves ] denotes positive finding; [  ] denotes negative finding  CARDIOVASCULAR:  Valu.Nieves ] chest pain   [ ]  chest pressure   [ ]  palpitations   [ ]  orthopnea   [ ]  dyspnea on exertion   [ ]  claudication   [ ]  rest pain   [ ]  DVT   [ ]  phlebitis PULMONARY:   [ ]  productive cough   [ ]  asthma   [ ]  wheezing NEUROLOGIC:   [ ]  weakness  [ ]  paresthesias  [ ]  aphasia  [ ]  amaurosis  [ ]  dizziness HEMATOLOGIC:   [ ]  bleeding problems   [ ]  clotting disorders MUSCULOSKELETAL:  [ ]  joint pain   [ ]  joint swelling Valu.Nieves ] leg swelling GASTROINTESTINAL: [ ]   blood in stool  [ ]   hematemesis GENITOURINARY:  [ ]   dysuria  [ ]   hematuria PSYCHIATRIC:  [ ]  history of major depression INTEGUMENTARY:  [ ]  rashes  Valu.Nieves ] ulcers CONSTITUTIONAL:  [ ]  fever   [ ]  chills  PHYSICAL EXAM: Filed Vitals:   12/12/13  1359  BP: 86/65  Pulse: 81  Height: 4' 10.5" (1.486 m)  Weight: 114 lb 11.2 oz (52.028 kg)  SpO2: 96%   Body mass index is 23.56 kg/(m^2). GENERAL: The patient is a well-nourished female, in no acute distress. The vital signs are documented above. CARDIOVASCULAR: There is a regular rate and rhythm. She has a systolic murmur. I do not detect carotid bruits. She has palpable femoral pulses bilaterally. I cannot palpate pedal pulses. She has significant bilateral lower extremity edema with blisters bilaterally especially on the right side. PULMONARY: There is good air exchange bilaterally without wheezing or rales. ABDOMEN: Soft and non-tender with normal pitched bowel sounds.  MUSCULOSKELETAL: There are no major deformities or cyanosis. NEUROLOGIC: No focal weakness or paresthesias are detected. SKIN: She has a superficial ulcer on the posterior aspect of her left leg which measures approximately a centimeter in diameter. PSYCHIATRIC: The patient has a normal affect.  DATA:  ARTERIAL DOPPLER:  Arterial Doppler study done on 12/05/2013 shows a biphasic right posterior tibial signal and a monophasic dorsalis pedis signal. ABI's could not be obtained because she has calcific vessels. However toe pressure on the right was 85 mm mercury which suggest adequate perfusion. On the left side, she has a biphasic posterior tibial signal and dorsalis pedis signal. Toe pressure on the left could not be obtained because of calcific disease. However based on her biphasic signals she appears to have reasonable circulation on the left also.  ARTERIAL DUPLEX: I have also independently interpreted her arterial duplex of her stent in the left leg which shows elevated velocities in the proximal stent consistent with a greater than 50% stenosis.  MEDICAL ISSUES:  Atherosclerosis of native arteries of the extremities with ulceration(440.23) This patient has a wound on her left posterior leg but based on her biphasic  signals in the left foot should have adequate circulation to heal this. However, she does have some area of increased velocities in her proximal left SFA stent that was placed in [place]. I discussed the option of proceeding with an arteriogram to determine if the stenosis in her left SFA stent was significant and needed to be addressed. She would have to  stop her Coumadin prior to the procedure and would likely need Lovenox given that she has an aortic valve. The family our would like to wait for the time being and therefore I plan on seeing her back in 3 months. At that time we will repeat her duplex of her stent in it is any progression of the stenosis then I think it would be important to proceed with arteriography to evaluate this for re intervention. Certainly if the wound has not healed by that point then the stenosis we'll likely need to be addressed. The family will call sooner if there is any problems. Otherwise I'll plan on seeing her back in 3 months.    Return in about 3 months (around 03/13/2014).  [facility] [place] [facility]: [phone]

## 2013-12-12 NOTE — Assessment & Plan Note (Signed)
This patient has a wound on her left posterior leg but based on her biphasic signals in the left foot should have adequate circulation to heal this. However, she does have some area of increased velocities in her proximal left SFA stent that was placed in [place]. I discussed the option of proceeding with an arteriogram to determine if the stenosis in her left SFA stent was significant and needed to be addressed. She would have to stop her Coumadin prior to the procedure and would likely need Lovenox given that she has an aortic valve. The family our would like to wait for the time being and therefore I plan on seeing her back in 3 months. At that time we will repeat her duplex of her stent in it is any progression of the stenosis then I think it would be important to proceed with arteriography to evaluate this for re intervention. Certainly if the wound has not healed by that point then the stenosis we'll likely need to be addressed. The family will call sooner if there is any problems. Otherwise I'll plan on seeing her back in 3 months.

## 2013-12-26 ENCOUNTER — Encounter (HOSPITAL_COMMUNITY): Payer: Medicare Other

## 2013-12-26 ENCOUNTER — Other Ambulatory Visit (HOSPITAL_COMMUNITY): Payer: Medicare Other

## 2013-12-26 ENCOUNTER — Ambulatory Visit: Payer: Medicare Other | Admitting: Vascular Surgery

## 2013-12-31 ENCOUNTER — Ambulatory Visit: Payer: Medicare Other | Admitting: Cardiology

## 2014-01-03 ENCOUNTER — Ambulatory Visit
Admission: RE | Admit: 2014-01-03 | Discharge: 2014-01-03 | Disposition: A | Discharge status: Home / Self Care | Payer: Medicare Other | Source: Ambulatory Visit | Attending: Internal Medicine | Admitting: Internal Medicine

## 2014-01-03 ENCOUNTER — Other Ambulatory Visit: Payer: Self-pay | Admitting: Internal Medicine

## 2014-01-03 DIAGNOSIS — R0789 Other chest pain: Secondary | ICD-10-CM

## 2014-01-08 ENCOUNTER — Encounter (HOSPITAL_BASED_OUTPATIENT_CLINIC_OR_DEPARTMENT_OTHER): Payer: Medicare Other | Attending: General Surgery

## 2014-01-08 ENCOUNTER — Other Ambulatory Visit (HOSPITAL_COMMUNITY): Payer: Medicare Other

## 2014-01-08 DIAGNOSIS — L97819 Non-pressure chronic ulcer of other part of right lower leg with unspecified severity: Secondary | ICD-10-CM | POA: Insufficient documentation

## 2014-01-08 DIAGNOSIS — L97829 Non-pressure chronic ulcer of other part of left lower leg with unspecified severity: Secondary | ICD-10-CM | POA: Insufficient documentation

## 2014-01-08 DIAGNOSIS — I70203 Unspecified atherosclerosis of native arteries of extremities, bilateral legs: Secondary | ICD-10-CM | POA: Insufficient documentation

## 2014-01-09 NOTE — H&P (Signed)
NAMECHRISTIE, [name] NO.:  [id number]  MEDICAL RECORD NO.:  [id number]  LOCATION:  FOOT                         FACILITY:  [facility]  PHYSICIAN:  Elesa Hacker, M.D.        DATE OF BIRTH:  [birth year]  DATE OF ADMISSION:  01/08/2014 DATE OF DISCHARGE:                             HISTORY & PHYSICAL   CHIEF COMPLAINT:  Wounds, both legs.  HISTORY OF PRESENT ILLNESS:  This is an 80 year old female, who developed spontaneous wounds on the posterior aspects of both legs several months ago.  These have been treated with daily washings.  PAST MEDICAL HISTORY:  The patient has a long history of multiple vasculopathies, has had an aortic valve replacement and CABG.  Has had several leg revascularizations and stents.  Also her medical history reveals atrial fibrillation, coronary artery disease, hypothyroidism, severe DJD and osteoporosis, hyperlipidemia.  She has a thoracic aortic aneurysm, kidney stones, history of colon polyps.  Cigarettes, none.  Alcohol, none.  MEDICATIONS:  Aspirin, calcium carbonate, Parafon, Lasix, Norco, Synthroid, prednisone, Betapace, sotalol, Restoril, and Coumadin.  REVIEW OF SYSTEMS:  Essentially as above.  PHYSICAL EXAMINATION:  VITAL SIGNS:  Temp 97.7, pulse 62 and regular, respirations 17, blood pressure 122/74. GENERAL APPEARANCE:  Well developed, a slender woman in distress because of back pain and leg pain. CHEST:  Clear. HEART:  Slightly irregular. EXTREMITIES:  Evaluation of the extremities reveals peripheral pulses not palpable.  The feet are cold.  The skin is dry and hairless.  On the right posterior leg, there is an 1.0 x 0.5 x 0.1 wound.  Distally, there is an 1.0 x 0.5 x 0.1 wound.  On the left posterior leg, there is a 6.0 x 2.5 x 0.1 wound.  IMPRESSION:  Arteriosclerotic ulcers with probable critical ischemia.  PLAN OF TREATMENT:  We will start with Santyl.  The patient is seeing Dr. Kellie Simmering.    Elesa Hacker,  M.D.    RA/MEDQ  D:  01/08/2014  T:  01/09/2014  Job:  [id number]

## 2014-01-15 DIAGNOSIS — L97819 Non-pressure chronic ulcer of other part of right lower leg with unspecified severity: Secondary | ICD-10-CM | POA: Diagnosis not present

## 2014-01-15 DIAGNOSIS — I70203 Unspecified atherosclerosis of native arteries of extremities, bilateral legs: Secondary | ICD-10-CM | POA: Diagnosis not present

## 2014-01-15 DIAGNOSIS — L97829 Non-pressure chronic ulcer of other part of left lower leg with unspecified severity: Secondary | ICD-10-CM | POA: Diagnosis not present

## 2014-01-16 ENCOUNTER — Encounter (HOSPITAL_BASED_OUTPATIENT_CLINIC_OR_DEPARTMENT_OTHER): Payer: Medicare Other

## 2014-01-22 ENCOUNTER — Encounter (HOSPITAL_BASED_OUTPATIENT_CLINIC_OR_DEPARTMENT_OTHER): Payer: Medicare Other | Attending: General Surgery

## 2014-01-22 DIAGNOSIS — L97819 Non-pressure chronic ulcer of other part of right lower leg with unspecified severity: Secondary | ICD-10-CM | POA: Insufficient documentation

## 2014-01-22 DIAGNOSIS — I7025 Atherosclerosis of native arteries of other extremities with ulceration: Secondary | ICD-10-CM | POA: Insufficient documentation

## 2014-01-22 DIAGNOSIS — L97829 Non-pressure chronic ulcer of other part of left lower leg with unspecified severity: Secondary | ICD-10-CM | POA: Insufficient documentation

## 2014-01-22 DIAGNOSIS — L98499 Non-pressure chronic ulcer of skin of other sites with unspecified severity: Secondary | ICD-10-CM | POA: Insufficient documentation

## 2014-01-29 DIAGNOSIS — L97829 Non-pressure chronic ulcer of other part of left lower leg with unspecified severity: Secondary | ICD-10-CM | POA: Diagnosis not present

## 2014-01-29 DIAGNOSIS — I7025 Atherosclerosis of native arteries of other extremities with ulceration: Secondary | ICD-10-CM | POA: Diagnosis not present

## 2014-01-29 DIAGNOSIS — L98499 Non-pressure chronic ulcer of skin of other sites with unspecified severity: Secondary | ICD-10-CM | POA: Diagnosis not present

## 2014-01-29 DIAGNOSIS — L97819 Non-pressure chronic ulcer of other part of right lower leg with unspecified severity: Secondary | ICD-10-CM | POA: Diagnosis not present

## 2014-02-05 ENCOUNTER — Ambulatory Visit (INDEPENDENT_AMBULATORY_CARE_PROVIDER_SITE_OTHER): Payer: Medicare Other | Admitting: Cardiology

## 2014-02-05 ENCOUNTER — Encounter: Payer: Self-pay | Admitting: Cardiology

## 2014-02-05 VITALS — BP 100/78 | HR 79 | Ht 58.5 in | Wt 110.0 lb

## 2014-02-05 DIAGNOSIS — I480 Paroxysmal atrial fibrillation: Secondary | ICD-10-CM

## 2014-02-05 DIAGNOSIS — I739 Peripheral vascular disease, unspecified: Secondary | ICD-10-CM

## 2014-02-05 DIAGNOSIS — L98499 Non-pressure chronic ulcer of skin of other sites with unspecified severity: Secondary | ICD-10-CM

## 2014-02-05 DIAGNOSIS — I77810 Thoracic aortic ectasia: Secondary | ICD-10-CM

## 2014-02-05 DIAGNOSIS — I2510 Atherosclerotic heart disease of native coronary artery without angina pectoris: Secondary | ICD-10-CM

## 2014-02-05 DIAGNOSIS — I7025 Atherosclerosis of native arteries of other extremities with ulceration: Secondary | ICD-10-CM

## 2014-02-05 NOTE — Progress Notes (Addendum)
[place]. [address] [place], [place]  [address] Phone: [phone] Fax:  [phone]  Date:  02/05/2014   ID:  [name], DOB January 09, 1923, MRN [id number]  PCP:  Criselda Peaches, MD   History of Present Illness: [name] is a 80 y.o. female here for CAD, edema follow up.  In July of 2005 she underwent aortic valve replacement with single vessel bypass in [place]. Bioprosthetic aortic valve. She also has peripheral vascular disease with left SFA stent placed in [place] in 2005. She is currently following with Dr. Scot Dock. Kellie Simmering in future according to daughter.   Several years ago she was diagnosed with atrial fibrillation. She is currently on chronic anticoagulation followed by Dr. Nyoka Cowden. She has been maintained on sotalol currently very low-dose 40 mg twice a day. QT interval normal.  Last EKG demonstrated sinus rhythm.  Over the last several months, she had developed lower extremity edema, bulla formation with weeping. She is currently wearing her compression hose that are 57 years old from her previous surgery. Daughter is friends with Dr. Ron Parker, she is wife of local retired Paediatric nurse.   Last EKG demonstrated sinus rhythm with rare PAC. 10/08/2013.  PMR - prednisode. Also has compression fractures of vertebral bodies. Chronic back pain.  Since taking Lasix, lower extremity edema has improved however she still continues to have weeping wounds. She is seeing wound care center as well. Vascular surgery, Dr. Scot Dock note reviewed. Suggested angiogram.   Wt Readings from Last 3 Encounters:  02/05/14 110 lb (49.896 kg)  12/12/13 114 lb 11.2 oz (52.028 kg)  11/15/13 110 lb (49.896 kg)     Past Medical History  Diagnosis Date  . Atrial fibrillation   . PUD (peptic ulcer disease)   . Coronary artery disease   . Thyroid disease     Hypothyroidism  . DJD (degenerative joint disease)   . DJD (degenerative joint disease)   . Hyperlipidemia   . Osteoporosis    . Thoracic aortic aneurysm   . Colon polyps   . Kidney stone     Past Surgical History  Procedure Laterality Date  . Cardiac valve replacement      aortic valve  . Renal artery stent    . Aortic valve replacement  03/29/2003  . Coronary artery bypass graft  03/29/2003  . Femoral artery stent Left 04/2003    Current Outpatient Prescriptions  Medication Sig Dispense Refill  . aspirin 81 MG tablet Take 81 mg by mouth every other day.    . Calcium Carbonate-Vitamin D (CALCIUM 600+D) 600-200 MG-UNIT TABS Take 2 tablets by mouth daily.    . chlorzoxazone (PARAFON) 500 MG tablet Take 250 mg by mouth 3 (three) times daily.     Mariane Baumgarten Calcium (STOOL SOFTENER PO) Take by mouth as needed.    . furosemide (LASIX) 20 MG tablet Take 40 mg by mouth 2 (two) times daily.     Marland Kitchen HYDROcodone-acetaminophen (NORCO/VICODIN) 5-325 MG per tablet     . levothyroxine (SYNTHROID, LEVOTHROID) 88 MCG tablet Take 88 mcg by mouth daily.    . predniSONE (DELTASONE) 5 MG tablet Take 5 mg by mouth 2 (two) times daily with a meal.    . sotalol (BETAPACE) 80 MG tablet Take 40 mg by mouth 2 (two) times daily.    . temazepam (RESTORIL) 15 MG capsule Take 15 mg by mouth at bedtime.    Marland Kitchen warfarin (COUMADIN) 2.5 MG tablet Take 2.5 mg by  mouth See admin instructions. Take on Tuesday, Wednesday, Friday, Saturday and Sunday.    . warfarin (COUMADIN) 5 MG tablet Take 5 mg by mouth 2 (two) times a week. Take on Monday and Thursday     No current facility-administered medications for this visit.    Allergies:    Allergies  Allergen Reactions  . Shrimp [Shellfish Allergy] Hives    Social History:  The patient  reports that she has never smoked. She has never used smokeless tobacco. She reports that she does not drink alcohol or use illicit drugs.   Family History  Problem Relation Age of Onset  . Heart disease Mother   . Hypertension Mother   . Dementia Mother   . Heart disease Father   . Hypertension Father      Before age 66  . Heart attack Father     X ' s   2-3  . Heart disease Brother   . Hypertension Brother   . Diabetes Daughter     ROS:  Please see the history of present illness.   Denies any syncope, bleeding, orthopnea, PND. Positive for weeping lower extremities, improving. Occasional excoriations on skin.  All other systems reviewed and negative.   PHYSICAL EXAM: VS:  BP 100/78 mmHg  Pulse 79  Ht 4' 10.5" (1.486 m)  Wt 110 lb (49.896 kg)  BMI 22.60 kg/m2 Well nourished, well developed, in no acute distress HEENT: normal, [place]/AT, EOMI Neck: no JVD, normal carotid upstroke, no bruit Cardiac:  normal S1, S2; RRR; 2/6 systolic right upper sternal border murmurPrior chest wall scar Lungs:  clear to auscultation bilaterally, no wheezing, rhonchi or rales Abd: soft, nontender, no hepatomegaly, no bruits Ext: compression hose in place B. Skin: warm and dry GU: deferred Neuro: no focal abnormalities noted, AAO x 3  EKG:  09/08/13-sinus rhythm, 67, PAC, QT interval 468 ms.  Echocardiogram: 12/06/13 - Left ventricle: The cavity size was normal. Wall thickness was increased in a pattern of moderate LVH. Systolic function was normal. The estimated ejection fraction was in the range of 55% to 60%. Wall motion was normal; there were no regional wall motion abnormalities. Doppler parameters are consistent with abnormal left ventricular relaxation (grade 1 diastolic dysfunction). Doppler parameters are consistent with high ventricular filling pressure. - Aortic valve: A bioprosthesis was present. - Ascending aorta: The ascending aorta was moderately dilated. - Mitral valve: Calcified annulus. - Left atrium: The atrium was mildly dilated.  Labs: Creatinine 0.54, potassium 3.8, hemoglobin 13.1, INR 3.5     ASSESSMENT AND PLAN:  1. Weeping lower extremities-Echocardiogram overall reassuring with normal EF however elevated left atrial filling pressures were noted.  Agree with  continued Lasix, compression hose, salt limitation, fluid limitation, raising of lower extremities. She is currently seeing wound care clinic. Nonhealing wounds. Vascular surgery note reviewed. Angiogram was recommended. Agreed. 2. Bioprosthetic aortic valve secondary to aortic valve stenosis- echocardiogram reassuring. Dental antibiotic prophylaxis. 3. Paroxysmal atrial fibrillation-currently sinus rhythm, low dose sotalol. We discussed the possibility of every 24 hours dosing however she is doing very well on very low dose split dosing at 40 mg twice a day. We will continue. 4. Dilated aortic root, ascending, 5.2 x 4.6 cm on CT scan. Measures 2.9 cm at the sinotubular junction we will continue to monitor. Would be high risk for surgical procedure. No evidence of dissection. 5. Coronary artery disease-single vessel bypass performed in 2005. Stable. 6. Aortic atherosclerosis-seen on CT scan 7. I will see her back in  6 months. She is getting blood work on Thursday with Dr. Nyoka Cowden. Important to monitor renal function with  Lasix.   Signed, Candee Furbish, MD [facility]  02/05/2014 2:28 PM

## 2014-02-05 NOTE — Patient Instructions (Signed)
The current medical regimen is effective;  continue present plan and medications.  Follow up in 6 months with Dr. Skains.  You will receive a letter in the mail 2 months before you are due.  Please call us when you receive this letter to schedule your follow up appointment.  

## 2014-02-12 DIAGNOSIS — L98499 Non-pressure chronic ulcer of skin of other sites with unspecified severity: Secondary | ICD-10-CM | POA: Diagnosis not present

## 2014-02-12 DIAGNOSIS — L97819 Non-pressure chronic ulcer of other part of right lower leg with unspecified severity: Secondary | ICD-10-CM | POA: Diagnosis not present

## 2014-02-12 DIAGNOSIS — I7025 Atherosclerosis of native arteries of other extremities with ulceration: Secondary | ICD-10-CM | POA: Diagnosis not present

## 2014-02-12 DIAGNOSIS — L97829 Non-pressure chronic ulcer of other part of left lower leg with unspecified severity: Secondary | ICD-10-CM | POA: Diagnosis not present

## 2014-02-19 ENCOUNTER — Encounter (HOSPITAL_BASED_OUTPATIENT_CLINIC_OR_DEPARTMENT_OTHER): Payer: Medicare Other | Attending: General Surgery

## 2014-02-19 DIAGNOSIS — L97219 Non-pressure chronic ulcer of right calf with unspecified severity: Secondary | ICD-10-CM | POA: Diagnosis not present

## 2014-02-19 DIAGNOSIS — L97229 Non-pressure chronic ulcer of left calf with unspecified severity: Secondary | ICD-10-CM | POA: Insufficient documentation

## 2014-02-26 DIAGNOSIS — L97219 Non-pressure chronic ulcer of right calf with unspecified severity: Secondary | ICD-10-CM | POA: Diagnosis not present

## 2014-02-26 DIAGNOSIS — L97229 Non-pressure chronic ulcer of left calf with unspecified severity: Secondary | ICD-10-CM | POA: Diagnosis not present

## 2014-03-05 DIAGNOSIS — L97219 Non-pressure chronic ulcer of right calf with unspecified severity: Secondary | ICD-10-CM | POA: Diagnosis not present

## 2014-03-05 DIAGNOSIS — L97229 Non-pressure chronic ulcer of left calf with unspecified severity: Secondary | ICD-10-CM | POA: Diagnosis not present

## 2014-03-11 ENCOUNTER — Encounter: Payer: Self-pay | Admitting: Vascular Surgery

## 2014-03-12 ENCOUNTER — Ambulatory Visit (HOSPITAL_COMMUNITY)
Admission: RE | Admit: 2014-03-12 | Discharge: 2014-03-12 | Disposition: A | Discharge status: Home / Self Care | Payer: Medicare Other | Source: Ambulatory Visit | Attending: Vascular Surgery | Admitting: Vascular Surgery

## 2014-03-12 ENCOUNTER — Ambulatory Visit (INDEPENDENT_AMBULATORY_CARE_PROVIDER_SITE_OTHER): Payer: Medicare Other | Admitting: Vascular Surgery

## 2014-03-12 ENCOUNTER — Encounter: Payer: Self-pay | Admitting: Vascular Surgery

## 2014-03-12 ENCOUNTER — Ambulatory Visit (INDEPENDENT_AMBULATORY_CARE_PROVIDER_SITE_OTHER)
Admission: RE | Admit: 2014-03-12 | Discharge: 2014-03-12 | Disposition: A | Discharge status: Home / Self Care | Payer: Medicare Other | Source: Ambulatory Visit | Attending: Vascular Surgery | Admitting: Vascular Surgery

## 2014-03-12 VITALS — BP 138/86 | HR 73 | Ht 58.5 in | Wt 107.2 lb

## 2014-03-12 DIAGNOSIS — Z48812 Encounter for surgical aftercare following surgery on the circulatory system: Secondary | ICD-10-CM

## 2014-03-12 DIAGNOSIS — I739 Peripheral vascular disease, unspecified: Secondary | ICD-10-CM

## 2014-03-12 DIAGNOSIS — I70203 Unspecified atherosclerosis of native arteries of extremities, bilateral legs: Secondary | ICD-10-CM | POA: Diagnosis present

## 2014-03-12 DIAGNOSIS — I70202 Unspecified atherosclerosis of native arteries of extremities, left leg: Secondary | ICD-10-CM

## 2014-03-12 NOTE — Progress Notes (Signed)
Subjective:     Patient ID: [name], female   DOB: 1922-06-29, 80 y.o.   MRN: [id number]  HPI 80 year old female returns for continued follow-up regarding her lower extremity occlusive disease and nonhealing ulcerations. She saw Dr. Doren Custard several months ago. She has requested that I see her today. She has a history of multiple stents placed in [place] from 2005 on. The left SFA has a stent. Apparently the right SFA was chronically occluded and stent was not feasible. She has good collaterals in the right leg. She had a blistered area in the left posterior calf when she saw Dr. Doren Custard several months ago. This has now progressed to a large ulcerated area and she has developed a similar problem in the right leg. She is going to the wound center is being cared for by Dr. Elesa Hacker. Santil is being applied to the wound. There has been no improvement. She has a history of DVT or thrombophlebitis.  Past Medical History  Diagnosis Date  . Atrial fibrillation   . PUD (peptic ulcer disease)   . Coronary artery disease   . Thyroid disease     Hypothyroidism  . DJD (degenerative joint disease)   . DJD (degenerative joint disease)   . Hyperlipidemia   . Osteoporosis   . Thoracic aortic aneurysm   . Colon polyps   . Kidney stone     History  Substance Use Topics  . Smoking status: Never Smoker   . Smokeless tobacco: Never Used  . Alcohol Use: No    Family History  Problem Relation Age of Onset  . Heart disease Mother   . Hypertension Mother   . Dementia Mother   . Heart disease Father   . Hypertension Father     Before age 87  . Heart attack Father     X ' s   2-3  . Heart disease Brother   . Hypertension Brother   . Diabetes Daughter     Allergies  Allergen Reactions  . Shrimp [Shellfish Allergy] Hives    Current outpatient prescriptions: aspirin 81 MG tablet, Take 81 mg by mouth every other day., Disp: , Rfl: ;  Calcium Carbonate-Vitamin D (CALCIUM 600+D) 600-200 MG-UNIT TABS,  Take 2 tablets by mouth daily., Disp: , Rfl: ;  chlorzoxazone (PARAFON) 500 MG tablet, Take 250 mg by mouth 3 (three) times daily. , Disp: , Rfl: ;  Docusate Calcium (STOOL SOFTENER PO), Take by mouth as needed., Disp: , Rfl:  furosemide (LASIX) 20 MG tablet, Take 40 mg by mouth 2 (two) times daily. , Disp: , Rfl: ;  HYDROcodone-acetaminophen (NORCO/VICODIN) 5-325 MG per tablet, , Disp: , Rfl: ;  levothyroxine (SYNTHROID, LEVOTHROID) 88 MCG tablet, Take 88 mcg by mouth daily., Disp: , Rfl: ;  predniSONE (DELTASONE) 5 MG tablet, Take 5 mg by mouth 2 (two) times daily with a meal., Disp: , Rfl:  sotalol (BETAPACE) 80 MG tablet, Take 40 mg by mouth 2 (two) times daily., Disp: , Rfl: ;  temazepam (RESTORIL) 15 MG capsule, Take 15 mg by mouth at bedtime., Disp: , Rfl: ;  warfarin (COUMADIN) 2.5 MG tablet, Take 2.5 mg by mouth See admin instructions. Take on Tuesday, Wednesday, Friday, Saturday and Sunday., Disp: , Rfl: ;  warfarin (COUMADIN) 5 MG tablet, Take 5 mg by mouth 2 (two) times a week. Take on Monday and Thursday, Disp: , Rfl:   BP 138/86 mmHg  Pulse 73  Ht 4' 10.5" (1.486 m)  Wt 107 lb  3.2 oz (48.626 kg)  BMI 22.02 kg/m2  SpO2 98%  Body mass index is 22.02 kg/(m^2).           Review of Systems denies chest pain. Does have history of bovine valve replacement and is on chronic Coumadin therapy followed by Dr. Candee Furbish. Patient has severe back problems is unable to lie flat.     Objective:   Physical Exam BP 138/86 mmHg  Pulse 73  Ht 4' 10.5" (1.486 m)  Wt 107 lb 3.2 oz (48.626 kg)  BMI 22.02 kg/m2  SpO2 98%  Gen.-alert and oriented x3 in no apparent distress-very frail in appearance and chronically ill HEENT normal for age Lungs no rhonchi or wheezing Cardiovascular regular rhythm no murmurs carotid pulses 3+ palpable no bruits audible Abdomen soft nontender no palpable masses Musculoskeletal free of  major deformities Skin clear -no rashes Neurologic normal Lower  extremities 2+ femoral pulses palpable.-Bilateral-no popliteal pulses palpable bilaterally and no pedal pulses palpable. Thin is very skin with multiple small ulcerations and pretibial area. Extensive ulcer posterior left calf measuring about 5 x 4 cm and posterior right calf measuring about 3 cm in diameter. Brisk Doppler flow in both feet left worse than right. PT is biphasic.  Today I ordered a duplex scan of the left leg which I reviewed and interpreted. There is some mild stenosis of the origin or proximal to the stent in the left SFA. Distal vessels are noncompressible. ABI on the right leg is 0.62. There is monophasic flow in the right foot and biphasic flow in the left posterior tibial.      Assessment:     Nonhealing ulcers bilaterally and patient with valve replacement on chronic Coumadin and unable to lie flat because of severe back problems. Previous stent left SFA with possible stenosis at origin and known distal tibial disease  Discussed all these options with patient's family including obtaining venous reflux study as patient could have reflux contributing to this problem but I think it's unlikely that laser ablation will heal these ulcers. Appears more like pressure sores today. In order to obtain angiogram and possible intervention this would require removal from Coumadin with Lovenox bridge and possibly general anesthesia. Family does not want to proceed with that. I have explained that I do not think these ulcerations will heal in its unlikely we will find something with intervention that will significantly improved the situation enough to heal the ulcers although that is possible.    Plan:     #1 will schedule bilateral reflux study in the next few weeks to make sure she does not have a venous component #2 patient will return to wound center for local wound care as per request of family #3 if they should change their mind in the future we can arrange angiography with possible  intervention under general anesthesia.

## 2014-03-13 ENCOUNTER — Other Ambulatory Visit: Payer: Self-pay

## 2014-03-13 DIAGNOSIS — I739 Peripheral vascular disease, unspecified: Secondary | ICD-10-CM

## 2014-03-18 ENCOUNTER — Ambulatory Visit (HOSPITAL_COMMUNITY)
Admission: RE | Admit: 2014-03-18 | Discharge: 2014-03-18 | Disposition: A | Discharge status: Home / Self Care | Payer: Medicare Other | Source: Ambulatory Visit | Attending: Vascular Surgery | Admitting: Vascular Surgery

## 2014-03-18 DIAGNOSIS — I739 Peripheral vascular disease, unspecified: Secondary | ICD-10-CM

## 2014-03-19 DIAGNOSIS — L97229 Non-pressure chronic ulcer of left calf with unspecified severity: Secondary | ICD-10-CM | POA: Diagnosis not present

## 2014-03-19 DIAGNOSIS — L97219 Non-pressure chronic ulcer of right calf with unspecified severity: Secondary | ICD-10-CM | POA: Diagnosis not present

## 2014-03-20 ENCOUNTER — Ambulatory Visit: Payer: Medicare Other | Admitting: Vascular Surgery

## 2014-03-20 ENCOUNTER — Other Ambulatory Visit (HOSPITAL_COMMUNITY): Payer: Medicare Other

## 2014-03-20 ENCOUNTER — Encounter (HOSPITAL_COMMUNITY): Payer: Medicare Other

## 2014-03-26 DIAGNOSIS — R609 Edema, unspecified: Secondary | ICD-10-CM | POA: Diagnosis not present

## 2014-03-26 DIAGNOSIS — I4891 Unspecified atrial fibrillation: Secondary | ICD-10-CM | POA: Diagnosis not present

## 2014-03-26 DIAGNOSIS — I2510 Atherosclerotic heart disease of native coronary artery without angina pectoris: Secondary | ICD-10-CM | POA: Diagnosis not present

## 2014-03-26 DIAGNOSIS — R799 Abnormal finding of blood chemistry, unspecified: Secondary | ICD-10-CM | POA: Diagnosis not present

## 2014-03-26 DIAGNOSIS — I739 Peripheral vascular disease, unspecified: Secondary | ICD-10-CM | POA: Diagnosis not present

## 2014-03-28 ENCOUNTER — Telehealth: Payer: Self-pay | Admitting: *Deleted

## 2014-03-28 NOTE — Telephone Encounter (Signed)
Called patient and her daughter to go over the results of her reflux study ordered by Dr. Kellie Simmering and read by Dr. Scot Dock. The patient has no superficial reflux so therefore, is not a candidate for laser ablation. She does have deep reflux so I explained the treatment for that being elevation, esp at night, and compression first thing in the am. The patient is doing that usually but currently has ulcers. She is wrapping with ace wraps but plans to resume stockings when the ulcers heal. Pt expressed understanding. Will follow prn.

## 2014-03-29 ENCOUNTER — Ambulatory Visit (INDEPENDENT_AMBULATORY_CARE_PROVIDER_SITE_OTHER): Payer: Medicare Other | Admitting: Podiatrist

## 2014-03-29 ENCOUNTER — Encounter: Payer: Self-pay | Admitting: Podiatrist

## 2014-03-29 VITALS — BP 124/62 | HR 63 | Resp 16

## 2014-03-29 DIAGNOSIS — M79676 Pain in unspecified toe(s): Secondary | ICD-10-CM | POA: Diagnosis not present

## 2014-03-29 DIAGNOSIS — B351 Tinea unguium: Secondary | ICD-10-CM

## 2014-03-29 DIAGNOSIS — R609 Edema, unspecified: Secondary | ICD-10-CM | POA: Diagnosis not present

## 2014-03-29 NOTE — Progress Notes (Signed)
   Subjective:    Patient ID: [name], female    DOB: 1923/01/07, 81 y.o.   MRN: [id number]  HPI Comments: "I need the nails cut"  Patient states that she needs her toenails cut. She is unable to cut them herself. She has swelling in her legs and they weep a lot.   Toe Pain       Review of Systems  HENT: Positive for sinus pressure.   Cardiovascular: Positive for leg swelling.  Musculoskeletal: Positive for myalgias, back pain, arthralgias and gait problem.  Skin: Positive for wound.  Hematological: Bruises/bleeds easily.  All other systems reviewed and are negative.      Objective:   Physical Exam Vascular status is intact with pedal pulses palpable dp at 1/4 bilateral and unable to palpate pt pulses due to compressive wraps.  She has diagnosed venous insufficiency and has weeping lesions on both legs. Edema present bilateral also baseline.  Bilateral compressive wraps in place.  Neurological sensation is intact with light touch, and SWMF at 5/5 sites bilateral, vibration intact as well.  Biomechanical evaluation is deferred.  Patients toenails are elongated and uncomfortable x 6- 1,2 3 bilateral. The large toenails are especially symptomatic.  No sign of lesions on the feet noted.  No interdigital maceration present.       Assessment & Plan:  Symptomatic toenails x 6  Plan:  Debridement of toenails accomplished today without complication.  Also note, she is on coumadin so the nails were not taken back too far.  She will be seen back for routine care or as needed.  Her daughter was also with her at today's visit.

## 2014-04-02 ENCOUNTER — Encounter (HOSPITAL_BASED_OUTPATIENT_CLINIC_OR_DEPARTMENT_OTHER): Payer: Medicare Other | Attending: General Surgery

## 2014-04-02 DIAGNOSIS — L89892 Pressure ulcer of other site, stage 2: Secondary | ICD-10-CM | POA: Insufficient documentation

## 2014-04-02 DIAGNOSIS — L89612 Pressure ulcer of right heel, stage 2: Secondary | ICD-10-CM | POA: Insufficient documentation

## 2014-04-03 DIAGNOSIS — L97909 Non-pressure chronic ulcer of unspecified part of unspecified lower leg with unspecified severity: Secondary | ICD-10-CM | POA: Diagnosis not present

## 2014-04-09 DIAGNOSIS — L89612 Pressure ulcer of right heel, stage 2: Secondary | ICD-10-CM | POA: Diagnosis not present

## 2014-04-09 DIAGNOSIS — L89892 Pressure ulcer of other site, stage 2: Secondary | ICD-10-CM | POA: Diagnosis not present

## 2014-04-10 ENCOUNTER — Other Ambulatory Visit: Payer: Self-pay | Admitting: Internal Medicine

## 2014-04-10 DIAGNOSIS — I82409 Acute embolism and thrombosis of unspecified deep veins of unspecified lower extremity: Secondary | ICD-10-CM

## 2014-04-11 ENCOUNTER — Ambulatory Visit
Admission: RE | Admit: 2014-04-11 | Discharge: 2014-04-11 | Disposition: A | Discharge status: Home / Self Care | Payer: Medicare Other | Source: Ambulatory Visit | Attending: Internal Medicine | Admitting: Internal Medicine

## 2014-04-11 DIAGNOSIS — L97829 Non-pressure chronic ulcer of other part of left lower leg with unspecified severity: Secondary | ICD-10-CM | POA: Diagnosis not present

## 2014-04-11 DIAGNOSIS — M79605 Pain in left leg: Secondary | ICD-10-CM | POA: Diagnosis not present

## 2014-04-11 DIAGNOSIS — I82409 Acute embolism and thrombosis of unspecified deep veins of unspecified lower extremity: Secondary | ICD-10-CM

## 2014-04-11 DIAGNOSIS — R609 Edema, unspecified: Secondary | ICD-10-CM | POA: Diagnosis not present

## 2014-04-15 ENCOUNTER — Telehealth: Payer: Self-pay

## 2014-04-15 ENCOUNTER — Encounter: Payer: Self-pay | Admitting: Vascular Surgery

## 2014-04-15 NOTE — Telephone Encounter (Signed)
Daughter called on pt's behalf.  Requested an appt. with Dr. Kellie Simmering, per direction of Dr. Lindon Romp.  Stated her mother's left leg is very swollen, and she has multiple wounds from left knee to ankle, and a crack on her left heel; stated the wounds are weeping.  Reported her PCP ordered a venous study recently and it was negative for DVT.  Is requesting an appt. this week, if possible, prior to returning to the [facility], @ [place]. given for 12:45 PM, 1/26, with Dr. Kellie Simmering.  Agrees w/ plan.

## 2014-04-16 ENCOUNTER — Ambulatory Visit (INDEPENDENT_AMBULATORY_CARE_PROVIDER_SITE_OTHER): Payer: Medicare Other | Admitting: Vascular Surgery

## 2014-04-16 ENCOUNTER — Encounter: Payer: Self-pay | Admitting: Vascular Surgery

## 2014-04-16 VITALS — BP 160/94 | HR 72 | Resp 16 | Ht <= 58 in | Wt 110.0 lb

## 2014-04-16 DIAGNOSIS — I739 Peripheral vascular disease, unspecified: Secondary | ICD-10-CM | POA: Diagnosis not present

## 2014-04-16 NOTE — Progress Notes (Signed)
Subjective:     Patient ID: [name], female   DOB: March 07, 1923, 81 y.o.   MRN: [id number]  HPI  This 82 year old female his known to me having evaluated her 03/12/2014 for extensive ulcerations or extremities. She is seen today for further input regarding this. Patient has had previous stent left superficial femoral artery in [place] in 2005. It appeared that she might have a proximal stenosis in the artery proximal to the stent when we performed a duplex scan last visit. Has known right superficial femoral occlusion. She has had saphenous vein harvested from the left leg for coronary artery bypass grafting. She has chronic edema in both legs with very poor skin and extensive ulcerations anteriorly and posteriorly with large ulcerations posteriorly bilaterally. She has been going to the wound center in [place]. She has been seen by Dr. Elesa Hacker. She has appointment now to go to [facility] to the wound center for evaluation next week. She is on chronic Coumadin therapy and discussion of angiography and possible dilatation of the artery proximal to the stent has come up in the past but because of her severe back problems it was felt that she would need general anesthesia and family did not want to pursue that  at that time.  Past Medical History  Diagnosis Date  . Atrial fibrillation   . PUD (peptic ulcer disease)   . Coronary artery disease   . Thyroid disease     Hypothyroidism  . DJD (degenerative joint disease)   . DJD (degenerative joint disease)   . Hyperlipidemia   . Osteoporosis   . Thoracic aortic aneurysm   . Colon polyps   . Kidney stone     History  Substance Use Topics  . Smoking status: Never Smoker   . Smokeless tobacco: Never Used  . Alcohol Use: No    Family History  Problem Relation Age of Onset  . Heart disease Mother   . Hypertension Mother   . Dementia Mother   . Heart disease Father   . Hypertension Father     Before age 19  . Heart attack Father      X ' s   2-3  . Heart disease Brother   . Hypertension Brother   . Diabetes Daughter     Allergies  Allergen Reactions  . Shrimp [Shellfish Allergy] Hives     Current outpatient prescriptions:  .  aspirin 81 MG tablet, Take 81 mg by mouth every other day., Disp: , Rfl:  .  Calcium Carbonate-Vitamin D (CALCIUM 600+D) 600-200 MG-UNIT TABS, Take 2 tablets by mouth daily., Disp: , Rfl:  .  chlorzoxazone (PARAFON) 500 MG tablet, Take 250 mg by mouth 3 (three) times daily. , Disp: , Rfl:  .  Docusate Calcium (STOOL SOFTENER PO), Take by mouth as needed., Disp: , Rfl:  .  furosemide (LASIX) 20 MG tablet, Take 40 mg by mouth 2 (two) times daily. , Disp: , Rfl:  .  HYDROcodone-acetaminophen (NORCO/VICODIN) 5-325 MG per tablet, , Disp: , Rfl:  .  levothyroxine (SYNTHROID, LEVOTHROID) 88 MCG tablet, Take 88 mcg by mouth daily., Disp: , Rfl:  .  predniSONE (DELTASONE) 5 MG tablet, Take 5 mg by mouth 2 (two) times daily with a meal., Disp: , Rfl:  .  sotalol (BETAPACE) 80 MG tablet, Take 40 mg by mouth 2 (two) times daily., Disp: , Rfl:  .  temazepam (RESTORIL) 15 MG capsule, Take 15 mg by mouth at bedtime., Disp: , Rfl:  .  warfarin (COUMADIN) 2.5 MG tablet, Take 2.5 mg by mouth See admin instructions. Take on Tuesday, Wednesday, Friday, Saturday and Sunday., Disp: , Rfl:  .  warfarin (COUMADIN) 5 MG tablet, Take 5 mg by mouth 2 (two) times a week. Take on Monday and Thursday, Disp: , Rfl:   BP 160/94 mmHg  Pulse 72  Resp 16  Ht 4\' 10"  (1.473 m)  Wt 110 lb (49.896 kg)  BMI 23.00 kg/m2  Body mass index is 23 kg/(m^2).         Review of Systems  Denies active chest pain, dyspnea on exertion. Is in a wheelchair. Chronic Coumadin for coronary artery disease. Severe degenerative joint disease with back pain and osteoporosis.     Objective:   Physical Exam BP 160/94 mmHg  Pulse 72  Resp 16  Ht 4\' 10"  (1.473 m)  Wt 110 lb (49.896 kg)  BMI 23.00 kg/m2   Gen. Elderly female no  apparent distress alert and oriented 3  lungs no rhonchi or wheezing  lower extremities reveal extensive large ulceration posteriorly each about 5 cm in diameter with some granulation tissue. Small pretibial ulcerations bilaterally. Extensive erythema of legs from knee to ankle bilaterally with some seepage of clear fluid. 1+ edema distally.     I listened to the anterior and posterior tibial arteries at the ankle level bilaterally with hand-held Doppler and there is brisk flow bilaterally although it is not totally normal.    today I  Reviewed the venous duplex exam which was performed 03/18/2014 which revealed no gross reflux in the superficial venous system but some reflux in the deep system and no DVT    Assessment reflux in deep venous system: moderate stenosis in native artery left leg proximal to superficial femoral artery stent with otherwise patent artery      nonhealing ulcers bilateral lower extremities-probably started out as pressure sores posteriorly-due to a combinationof arterial insufficiency and  Deep vein reflux  Possible  Moderate stenosis in native artery left leg proximal to superficial femoral artery stent   chronic occlusion right superficial femoral artery with collateral flow    Plan:      I do not think  Intervention on the arterial system will heal these extensive ulcers nor is patient a reasonable operative candidate. Percutaneous intervention was not feasible in the right leg many years ago and PTA and stenting of the moderate stenosis in the left superficial femoral artery will likely not change her outcome   no venous interventions are indicated or feasible    I would agree with getting another opinion from the [facility] wound center Re: Daily care of these wounds but would not  recommend arterial interventions   discussed this at length with the patient and her friend who accompanied her.

## 2014-04-22 DIAGNOSIS — E039 Hypothyroidism, unspecified: Secondary | ICD-10-CM | POA: Diagnosis not present

## 2014-04-22 DIAGNOSIS — Z951 Presence of aortocoronary bypass graft: Secondary | ICD-10-CM | POA: Diagnosis not present

## 2014-04-22 DIAGNOSIS — I482 Chronic atrial fibrillation: Secondary | ICD-10-CM | POA: Diagnosis not present

## 2014-04-22 DIAGNOSIS — I872 Venous insufficiency (chronic) (peripheral): Secondary | ICD-10-CM | POA: Diagnosis not present

## 2014-04-22 DIAGNOSIS — L97901 Non-pressure chronic ulcer of unspecified part of unspecified lower leg limited to breakdown of skin: Secondary | ICD-10-CM | POA: Diagnosis not present

## 2014-04-22 DIAGNOSIS — R600 Localized edema: Secondary | ICD-10-CM | POA: Diagnosis not present

## 2014-04-22 DIAGNOSIS — I2510 Atherosclerotic heart disease of native coronary artery without angina pectoris: Secondary | ICD-10-CM | POA: Diagnosis not present

## 2014-04-22 DIAGNOSIS — M549 Dorsalgia, unspecified: Secondary | ICD-10-CM | POA: Diagnosis not present

## 2014-04-22 DIAGNOSIS — G8929 Other chronic pain: Secondary | ICD-10-CM | POA: Diagnosis not present

## 2014-04-22 DIAGNOSIS — I779 Disorder of arteries and arterioles, unspecified: Secondary | ICD-10-CM | POA: Diagnosis not present

## 2014-04-22 DIAGNOSIS — Z7901 Long term (current) use of anticoagulants: Secondary | ICD-10-CM | POA: Diagnosis not present

## 2014-04-25 DIAGNOSIS — L97901 Non-pressure chronic ulcer of unspecified part of unspecified lower leg limited to breakdown of skin: Secondary | ICD-10-CM | POA: Diagnosis not present

## 2014-04-29 DIAGNOSIS — L97821 Non-pressure chronic ulcer of other part of left lower leg limited to breakdown of skin: Secondary | ICD-10-CM | POA: Diagnosis not present

## 2014-04-29 DIAGNOSIS — L97911 Non-pressure chronic ulcer of unspecified part of right lower leg limited to breakdown of skin: Secondary | ICD-10-CM | POA: Diagnosis not present

## 2014-04-29 DIAGNOSIS — L97921 Non-pressure chronic ulcer of unspecified part of left lower leg limited to breakdown of skin: Secondary | ICD-10-CM | POA: Diagnosis not present

## 2014-04-29 DIAGNOSIS — I779 Disorder of arteries and arterioles, unspecified: Secondary | ICD-10-CM | POA: Diagnosis not present

## 2014-04-29 DIAGNOSIS — L97811 Non-pressure chronic ulcer of other part of right lower leg limited to breakdown of skin: Secondary | ICD-10-CM | POA: Diagnosis not present

## 2014-04-29 DIAGNOSIS — I872 Venous insufficiency (chronic) (peripheral): Secondary | ICD-10-CM | POA: Diagnosis not present

## 2014-05-09 DIAGNOSIS — E780 Pure hypercholesterolemia: Secondary | ICD-10-CM | POA: Diagnosis not present

## 2014-05-09 DIAGNOSIS — L97809 Non-pressure chronic ulcer of other part of unspecified lower leg with unspecified severity: Secondary | ICD-10-CM | POA: Diagnosis not present

## 2014-05-09 DIAGNOSIS — Z7901 Long term (current) use of anticoagulants: Secondary | ICD-10-CM | POA: Diagnosis not present

## 2014-05-09 DIAGNOSIS — E039 Hypothyroidism, unspecified: Secondary | ICD-10-CM | POA: Diagnosis not present

## 2014-05-09 DIAGNOSIS — I378 Other nonrheumatic pulmonary valve disorders: Secondary | ICD-10-CM | POA: Diagnosis not present

## 2014-05-09 DIAGNOSIS — D559 Anemia due to enzyme disorder, unspecified: Secondary | ICD-10-CM | POA: Diagnosis not present

## 2014-05-13 DIAGNOSIS — G8929 Other chronic pain: Secondary | ICD-10-CM | POA: Diagnosis not present

## 2014-05-13 DIAGNOSIS — I482 Chronic atrial fibrillation: Secondary | ICD-10-CM | POA: Diagnosis not present

## 2014-05-13 DIAGNOSIS — L97921 Non-pressure chronic ulcer of unspecified part of left lower leg limited to breakdown of skin: Secondary | ICD-10-CM | POA: Diagnosis not present

## 2014-05-13 DIAGNOSIS — M549 Dorsalgia, unspecified: Secondary | ICD-10-CM | POA: Diagnosis not present

## 2014-05-13 DIAGNOSIS — L97909 Non-pressure chronic ulcer of unspecified part of unspecified lower leg with unspecified severity: Secondary | ICD-10-CM | POA: Diagnosis not present

## 2014-05-13 DIAGNOSIS — L97911 Non-pressure chronic ulcer of unspecified part of right lower leg limited to breakdown of skin: Secondary | ICD-10-CM | POA: Diagnosis not present

## 2014-05-13 DIAGNOSIS — I779 Disorder of arteries and arterioles, unspecified: Secondary | ICD-10-CM | POA: Diagnosis not present

## 2014-05-13 DIAGNOSIS — Z7901 Long term (current) use of anticoagulants: Secondary | ICD-10-CM | POA: Diagnosis not present

## 2014-05-13 DIAGNOSIS — I872 Venous insufficiency (chronic) (peripheral): Secondary | ICD-10-CM | POA: Diagnosis not present

## 2014-05-13 DIAGNOSIS — I890 Lymphedema, not elsewhere classified: Secondary | ICD-10-CM | POA: Diagnosis not present

## 2014-05-20 DIAGNOSIS — I779 Disorder of arteries and arterioles, unspecified: Secondary | ICD-10-CM | POA: Diagnosis not present

## 2014-05-20 DIAGNOSIS — L97812 Non-pressure chronic ulcer of other part of right lower leg with fat layer exposed: Secondary | ICD-10-CM | POA: Diagnosis not present

## 2014-05-20 DIAGNOSIS — I739 Peripheral vascular disease, unspecified: Secondary | ICD-10-CM | POA: Diagnosis not present

## 2014-05-20 DIAGNOSIS — L97822 Non-pressure chronic ulcer of other part of left lower leg with fat layer exposed: Secondary | ICD-10-CM | POA: Diagnosis not present

## 2014-05-20 DIAGNOSIS — I872 Venous insufficiency (chronic) (peripheral): Secondary | ICD-10-CM | POA: Diagnosis not present

## 2014-05-24 DIAGNOSIS — L97909 Non-pressure chronic ulcer of unspecified part of unspecified lower leg with unspecified severity: Secondary | ICD-10-CM | POA: Diagnosis not present

## 2014-05-27 DIAGNOSIS — I872 Venous insufficiency (chronic) (peripheral): Secondary | ICD-10-CM | POA: Diagnosis not present

## 2014-05-27 DIAGNOSIS — I890 Lymphedema, not elsewhere classified: Secondary | ICD-10-CM | POA: Diagnosis not present

## 2014-05-27 DIAGNOSIS — I779 Disorder of arteries and arterioles, unspecified: Secondary | ICD-10-CM | POA: Diagnosis not present

## 2014-05-27 DIAGNOSIS — L97921 Non-pressure chronic ulcer of unspecified part of left lower leg limited to breakdown of skin: Secondary | ICD-10-CM | POA: Diagnosis not present

## 2014-05-27 DIAGNOSIS — L97911 Non-pressure chronic ulcer of unspecified part of right lower leg limited to breakdown of skin: Secondary | ICD-10-CM | POA: Diagnosis not present

## 2014-05-28 DIAGNOSIS — L97909 Non-pressure chronic ulcer of unspecified part of unspecified lower leg with unspecified severity: Secondary | ICD-10-CM | POA: Diagnosis not present

## 2014-05-30 DIAGNOSIS — I4891 Unspecified atrial fibrillation: Secondary | ICD-10-CM | POA: Diagnosis not present

## 2014-05-30 DIAGNOSIS — Z7901 Long term (current) use of anticoagulants: Secondary | ICD-10-CM | POA: Diagnosis not present

## 2014-06-04 DIAGNOSIS — I890 Lymphedema, not elsewhere classified: Secondary | ICD-10-CM | POA: Diagnosis not present

## 2014-06-04 DIAGNOSIS — L97921 Non-pressure chronic ulcer of unspecified part of left lower leg limited to breakdown of skin: Secondary | ICD-10-CM | POA: Diagnosis not present

## 2014-06-04 DIAGNOSIS — L97911 Non-pressure chronic ulcer of unspecified part of right lower leg limited to breakdown of skin: Secondary | ICD-10-CM | POA: Diagnosis not present

## 2014-06-04 DIAGNOSIS — I872 Venous insufficiency (chronic) (peripheral): Secondary | ICD-10-CM | POA: Diagnosis not present

## 2014-06-05 DIAGNOSIS — L97909 Non-pressure chronic ulcer of unspecified part of unspecified lower leg with unspecified severity: Secondary | ICD-10-CM | POA: Diagnosis not present

## 2014-06-12 DIAGNOSIS — I890 Lymphedema, not elsewhere classified: Secondary | ICD-10-CM | POA: Diagnosis not present

## 2014-06-18 DIAGNOSIS — L97909 Non-pressure chronic ulcer of unspecified part of unspecified lower leg with unspecified severity: Secondary | ICD-10-CM | POA: Diagnosis not present

## 2014-06-25 DIAGNOSIS — I779 Disorder of arteries and arterioles, unspecified: Secondary | ICD-10-CM | POA: Diagnosis not present

## 2014-06-25 DIAGNOSIS — I890 Lymphedema, not elsewhere classified: Secondary | ICD-10-CM | POA: Diagnosis not present

## 2014-06-25 DIAGNOSIS — I872 Venous insufficiency (chronic) (peripheral): Secondary | ICD-10-CM | POA: Diagnosis not present

## 2014-06-25 DIAGNOSIS — L97911 Non-pressure chronic ulcer of unspecified part of right lower leg limited to breakdown of skin: Secondary | ICD-10-CM | POA: Diagnosis not present

## 2014-06-25 DIAGNOSIS — L97921 Non-pressure chronic ulcer of unspecified part of left lower leg limited to breakdown of skin: Secondary | ICD-10-CM | POA: Diagnosis not present

## 2014-06-25 DIAGNOSIS — L97909 Non-pressure chronic ulcer of unspecified part of unspecified lower leg with unspecified severity: Secondary | ICD-10-CM | POA: Diagnosis not present

## 2014-07-03 DIAGNOSIS — L97909 Non-pressure chronic ulcer of unspecified part of unspecified lower leg with unspecified severity: Secondary | ICD-10-CM | POA: Diagnosis not present

## 2014-07-03 DIAGNOSIS — H2513 Age-related nuclear cataract, bilateral: Secondary | ICD-10-CM | POA: Diagnosis not present

## 2014-07-08 DIAGNOSIS — I4891 Unspecified atrial fibrillation: Secondary | ICD-10-CM | POA: Diagnosis not present

## 2014-07-08 DIAGNOSIS — R609 Edema, unspecified: Secondary | ICD-10-CM | POA: Diagnosis not present

## 2014-07-08 DIAGNOSIS — Z7901 Long term (current) use of anticoagulants: Secondary | ICD-10-CM | POA: Diagnosis not present

## 2014-07-08 DIAGNOSIS — R109 Unspecified abdominal pain: Secondary | ICD-10-CM | POA: Diagnosis not present

## 2014-07-17 ENCOUNTER — Ambulatory Visit: Payer: Medicare Other | Admitting: Vascular Surgery

## 2014-07-17 ENCOUNTER — Other Ambulatory Visit (HOSPITAL_COMMUNITY): Payer: Medicare Other

## 2014-07-17 ENCOUNTER — Encounter (HOSPITAL_COMMUNITY): Payer: Medicare Other

## 2014-07-17 DIAGNOSIS — L97909 Non-pressure chronic ulcer of unspecified part of unspecified lower leg with unspecified severity: Secondary | ICD-10-CM | POA: Diagnosis not present

## 2014-07-23 DIAGNOSIS — I739 Peripheral vascular disease, unspecified: Secondary | ICD-10-CM | POA: Diagnosis not present

## 2014-07-23 DIAGNOSIS — I482 Chronic atrial fibrillation: Secondary | ICD-10-CM | POA: Diagnosis not present

## 2014-07-23 DIAGNOSIS — I872 Venous insufficiency (chronic) (peripheral): Secondary | ICD-10-CM | POA: Diagnosis not present

## 2014-07-23 DIAGNOSIS — Z951 Presence of aortocoronary bypass graft: Secondary | ICD-10-CM | POA: Diagnosis not present

## 2014-07-23 DIAGNOSIS — Z7901 Long term (current) use of anticoagulants: Secondary | ICD-10-CM | POA: Diagnosis not present

## 2014-07-23 DIAGNOSIS — L97911 Non-pressure chronic ulcer of unspecified part of right lower leg limited to breakdown of skin: Secondary | ICD-10-CM | POA: Diagnosis not present

## 2014-07-23 DIAGNOSIS — L97921 Non-pressure chronic ulcer of unspecified part of left lower leg limited to breakdown of skin: Secondary | ICD-10-CM | POA: Diagnosis not present

## 2014-07-23 DIAGNOSIS — G8929 Other chronic pain: Secondary | ICD-10-CM | POA: Diagnosis not present

## 2014-07-23 DIAGNOSIS — I890 Lymphedema, not elsewhere classified: Secondary | ICD-10-CM | POA: Diagnosis not present

## 2014-07-23 DIAGNOSIS — Z9889 Other specified postprocedural states: Secondary | ICD-10-CM | POA: Diagnosis not present

## 2014-07-23 DIAGNOSIS — Z79899 Other long term (current) drug therapy: Secondary | ICD-10-CM | POA: Diagnosis not present

## 2014-07-23 DIAGNOSIS — I779 Disorder of arteries and arterioles, unspecified: Secondary | ICD-10-CM | POA: Diagnosis not present

## 2014-07-23 DIAGNOSIS — M549 Dorsalgia, unspecified: Secondary | ICD-10-CM | POA: Diagnosis not present

## 2014-07-29 DIAGNOSIS — L97909 Non-pressure chronic ulcer of unspecified part of unspecified lower leg with unspecified severity: Secondary | ICD-10-CM | POA: Diagnosis not present

## 2014-08-06 DIAGNOSIS — I890 Lymphedema, not elsewhere classified: Secondary | ICD-10-CM | POA: Diagnosis not present

## 2014-08-06 DIAGNOSIS — I779 Disorder of arteries and arterioles, unspecified: Secondary | ICD-10-CM | POA: Diagnosis not present

## 2014-08-06 DIAGNOSIS — L97921 Non-pressure chronic ulcer of unspecified part of left lower leg limited to breakdown of skin: Secondary | ICD-10-CM | POA: Diagnosis not present

## 2014-08-06 DIAGNOSIS — G8929 Other chronic pain: Secondary | ICD-10-CM | POA: Diagnosis not present

## 2014-08-06 DIAGNOSIS — I739 Peripheral vascular disease, unspecified: Secondary | ICD-10-CM | POA: Diagnosis not present

## 2014-08-06 DIAGNOSIS — I872 Venous insufficiency (chronic) (peripheral): Secondary | ICD-10-CM | POA: Diagnosis not present

## 2014-08-06 DIAGNOSIS — Z7901 Long term (current) use of anticoagulants: Secondary | ICD-10-CM | POA: Diagnosis not present

## 2014-08-06 DIAGNOSIS — M549 Dorsalgia, unspecified: Secondary | ICD-10-CM | POA: Diagnosis not present

## 2014-08-06 DIAGNOSIS — I4891 Unspecified atrial fibrillation: Secondary | ICD-10-CM | POA: Diagnosis not present

## 2014-08-06 DIAGNOSIS — L97911 Non-pressure chronic ulcer of unspecified part of right lower leg limited to breakdown of skin: Secondary | ICD-10-CM | POA: Diagnosis not present

## 2014-08-08 DIAGNOSIS — L97909 Non-pressure chronic ulcer of unspecified part of unspecified lower leg with unspecified severity: Secondary | ICD-10-CM | POA: Diagnosis not present

## 2014-08-13 DIAGNOSIS — L97921 Non-pressure chronic ulcer of unspecified part of left lower leg limited to breakdown of skin: Secondary | ICD-10-CM | POA: Diagnosis not present

## 2014-08-13 DIAGNOSIS — L97911 Non-pressure chronic ulcer of unspecified part of right lower leg limited to breakdown of skin: Secondary | ICD-10-CM | POA: Diagnosis not present

## 2014-08-13 DIAGNOSIS — I779 Disorder of arteries and arterioles, unspecified: Secondary | ICD-10-CM | POA: Diagnosis not present

## 2014-08-13 DIAGNOSIS — I890 Lymphedema, not elsewhere classified: Secondary | ICD-10-CM | POA: Diagnosis not present

## 2014-08-13 DIAGNOSIS — I872 Venous insufficiency (chronic) (peripheral): Secondary | ICD-10-CM | POA: Diagnosis not present

## 2014-08-15 ENCOUNTER — Encounter: Payer: Self-pay | Admitting: Cardiology

## 2014-08-15 ENCOUNTER — Ambulatory Visit (INDEPENDENT_AMBULATORY_CARE_PROVIDER_SITE_OTHER): Payer: Medicare Other | Admitting: Cardiology

## 2014-08-15 VITALS — BP 120/60 | HR 65 | Ht <= 58 in | Wt 106.0 lb

## 2014-08-15 DIAGNOSIS — Z954 Presence of other heart-valve replacement: Secondary | ICD-10-CM

## 2014-08-15 DIAGNOSIS — I2510 Atherosclerotic heart disease of native coronary artery without angina pectoris: Secondary | ICD-10-CM

## 2014-08-15 DIAGNOSIS — I77810 Thoracic aortic ectasia: Secondary | ICD-10-CM

## 2014-08-15 DIAGNOSIS — I480 Paroxysmal atrial fibrillation: Secondary | ICD-10-CM

## 2014-08-15 DIAGNOSIS — Z953 Presence of xenogenic heart valve: Secondary | ICD-10-CM

## 2014-08-15 NOTE — Patient Instructions (Signed)
Medication Instructions:  Your physician recommends that you continue on your current medications as directed. Please refer to the Current Medication list given to you today.  Follow-Up: Follow up in 6 months with Dr. Skains.  You will receive a letter in the mail 2 months before you are due.  Please call us when you receive this letter to schedule your follow up appointment.  Thank you for choosing [facility]!!     

## 2014-08-15 NOTE — Progress Notes (Signed)
[facility]. [address], [place]  [address] Phone: [phone] Fax:  [phone]  Date:  08/15/2014   ID:  [name], DOB 1922/05/03, MRN [id number]  PCP:  Criselda Peaches, MD   History of Present Illness: [name] is a 81 y.o. female here for CAD, edema follow up.  In July of 2005 she underwent aortic valve replacement with single vessel bypass in [place]. Bioprosthetic aortic valve. She also has peripheral vascular disease with left SFA stent placed in [place] in 2005. She is currently following with Dr. Scot Dock. Kellie Simmering in future according to daughter.   Kellie Simmering referred to [facility]. Dr. Zigmund Daniel wound center. Used pressure boots daily, legs wrapped. They're very pleased with her progress.  Several years ago she was diagnosed with atrial fibrillation. She is currently on chronic anticoagulation followed by Dr. Nyoka Cowden. She has been maintained on sotalol currently very low-dose 40 mg twice a day. QT interval normal.  Last EKG demonstrated sinus rhythm.  Daughter is friends with Dr. Ron Parker, she is wife of local retired Paediatric nurse.   [place]. Also has compression fractures of vertebral bodies. Chronic back pain.   Wt Readings from Last 3 Encounters:  08/15/14 106 lb (48.081 kg)  04/16/14 110 lb (49.896 kg)  03/12/14 107 lb 3.2 oz (48.626 kg)     Past Medical History  Diagnosis Date  . Atrial fibrillation   . PUD (peptic ulcer disease)   . Coronary artery disease   . Thyroid disease     Hypothyroidism  . DJD (degenerative joint disease)   . DJD (degenerative joint disease)   . Hyperlipidemia   . Osteoporosis   . Thoracic aortic aneurysm   . Colon polyps   . Kidney stone     Past Surgical History  Procedure Laterality Date  . Cardiac valve replacement      aortic valve  . Renal artery stent    . Aortic valve replacement  03/29/2003  . Coronary artery bypass graft  03/29/2003  . Femoral artery stent Left 04/2003    Current Outpatient  Prescriptions  Medication Sig Dispense Refill  . aspirin 81 MG tablet Take 81 mg by mouth every other day.    . Calcium Carbonate-Vitamin D (CALCIUM 600+D) 600-200 MG-UNIT TABS Take 2 tablets by mouth daily.    Mariane Baumgarten Calcium (STOOL SOFTENER PO) Take by mouth as needed.    . furosemide (LASIX) 20 MG tablet Take 20 mg by mouth daily.     Marland Kitchen HYDROcodone-acetaminophen (NORCO/VICODIN) 5-325 MG per tablet     . Hydrocodone-Acetaminophen 5-300 MG TABS Take 5-300 mg by mouth 4 (four) times daily.     Marland Kitchen levothyroxine (SYNTHROID, LEVOTHROID) 88 MCG tablet Take 88 mcg by mouth daily.    Marland Kitchen lidocaine (XYLOCAINE) 2 % jelly Apply 1 application topically once a week.     . predniSONE (DELTASONE) 5 MG tablet Take 5 mg by mouth daily with breakfast.     . sotalol (BETAPACE) 80 MG tablet Take 40 mg by mouth 2 (two) times daily.    . temazepam (RESTORIL) 15 MG capsule Take 15 mg by mouth at bedtime.    Marland Kitchen warfarin (COUMADIN) 2.5 MG tablet Take 2.5 mg by mouth See admin instructions. Take 2.5 by mouth everyday     No current facility-administered medications for this visit.    Allergies:    Allergies  Allergen Reactions  . Shrimp [Shellfish Allergy] Hives    Social History:  The patient  reports that she has never smoked. She has never used smokeless tobacco. She reports that she does not drink alcohol or use illicit drugs.   Family History  Problem Relation Age of Onset  . Heart disease Mother   . Hypertension Mother   . Dementia Mother   . Heart disease Father   . Hypertension Father     Before age 15  . Heart attack Father     X ' s   2-3  . Heart disease Brother   . Hypertension Brother   . Diabetes Daughter   . Stroke Neg Hx     ROS:  Please see the history of present illness.   Denies any syncope, bleeding, orthopnea, PND. Positive for weeping lower extremities, improving. Occasional excoriations on skin.  All other systems reviewed and negative.   PHYSICAL EXAM: VS:  BP 120/60 mmHg   Pulse 65  Ht 4\' 10"  (1.473 m)  Wt 106 lb (48.081 kg)  BMI 22.16 kg/m2 Well nourished, well developed, in no acute distress HEENT: normal, [place]/AT, EOMI Neck: no JVD, normal carotid upstroke, no bruit Cardiac:  normal S1, S2; RRR; 2/6 systolic right upper sternal border murmurPrior chest wall scar Lungs:  clear to auscultation bilaterally, no wheezing, rhonchi or rales Abd: soft, nontender, no hepatomegaly, no bruits Ext: compression hose in place B. Skin: warm and dry GU: deferred Neuro: no focal abnormalities noted, AAO x 3  EKG:  Today 08/15/14-sinus rhythm, sinus arrhythmia, left anterior fascicular block, left ventricular hypertrophy. 09/08/13-sinus rhythm, 67, PAC, QT interval 468 ms.  Echocardiogram: 12/06/13 - Left ventricle: The cavity size was normal. Wall thickness wasincreased in a pattern of moderate LVH. Systolic function wasnormal. The estimated ejection fraction was in the range of 55%to 60%. Wall motion was normal; there were no regional wallmotion abnormalities. Doppler parameters are consistent withabnormal left ventricular relaxation (grade 1 diastolicdysfunction). Doppler parameters are consistent with highventricular filling pressure. - Aortic valve: A bioprosthesis was present. Mean gradient 81mmHg. - Ascending aorta: The ascending aorta was moderately dilated. - Mitral valve: Calcified annulus. - Left atrium: The atrium was mildly dilated.  Labs: Creatinine 0.54, potassium 3.8, hemoglobin 13.1, INR 3.5     ASSESSMENT AND PLAN:  1. Weeping lower extremities-Echocardiogram overall reassuring with normal EF however elevated left atrial filling pressures were noted.  Agree with continued Lasix, compression hose, salt limitation, fluid limitation, raising of lower extremities. She is currently seeing wound care clinic. Nonhealing wounds. Vascular surgery note reviewed. Angiogram was recommended. Agreed. 2. Bioprosthetic aortic valve secondary to aortic valve  stenosis- echocardiogram reassuring. Dental antibiotic prophylaxis. 3. Paroxysmal atrial fibrillation-currently sinus rhythm, low dose sotalol. We discussed the possibility of every 24 hours dosing however she is doing very well on very low dose split dosing at 40 mg twice a day. We will continue. 4. Dilated aortic root, ascending, 5.2 x 4.6 cm on CT scan. Measures 2.9 cm at the sinotubular junction we will continue to monitor. Would be high risk for surgical procedure. No evidence of dissection. 5. Coronary artery disease-single vessel bypass performed in 2005. Stable. 6. Aortic atherosclerosis-seen on CT scan 7. Chronic anticoagulation - coumadin. Dr. Nyoka Cowden.  8. I will see her back in 6 months. Dr. Nyoka Cowden. Important to monitor renal function with  Lasix.   Signed, Candee Furbish, MD [facility]  08/15/2014 4:44 PM

## 2014-08-21 DIAGNOSIS — L97911 Non-pressure chronic ulcer of unspecified part of right lower leg limited to breakdown of skin: Secondary | ICD-10-CM | POA: Diagnosis not present

## 2014-08-21 DIAGNOSIS — I872 Venous insufficiency (chronic) (peripheral): Secondary | ICD-10-CM | POA: Diagnosis not present

## 2014-08-21 DIAGNOSIS — I779 Disorder of arteries and arterioles, unspecified: Secondary | ICD-10-CM | POA: Diagnosis not present

## 2014-08-21 DIAGNOSIS — L97921 Non-pressure chronic ulcer of unspecified part of left lower leg limited to breakdown of skin: Secondary | ICD-10-CM | POA: Diagnosis not present

## 2014-08-21 DIAGNOSIS — L97121 Non-pressure chronic ulcer of left thigh limited to breakdown of skin: Secondary | ICD-10-CM | POA: Diagnosis not present

## 2014-08-21 DIAGNOSIS — I890 Lymphedema, not elsewhere classified: Secondary | ICD-10-CM | POA: Diagnosis not present

## 2014-08-22 DIAGNOSIS — I4891 Unspecified atrial fibrillation: Secondary | ICD-10-CM | POA: Diagnosis not present

## 2014-08-23 DIAGNOSIS — R791 Abnormal coagulation profile: Secondary | ICD-10-CM | POA: Diagnosis not present

## 2014-08-27 DIAGNOSIS — M549 Dorsalgia, unspecified: Secondary | ICD-10-CM | POA: Diagnosis not present

## 2014-08-27 DIAGNOSIS — I872 Venous insufficiency (chronic) (peripheral): Secondary | ICD-10-CM | POA: Diagnosis not present

## 2014-08-27 DIAGNOSIS — I890 Lymphedema, not elsewhere classified: Secondary | ICD-10-CM | POA: Diagnosis not present

## 2014-08-27 DIAGNOSIS — L97121 Non-pressure chronic ulcer of left thigh limited to breakdown of skin: Secondary | ICD-10-CM | POA: Diagnosis not present

## 2014-08-27 DIAGNOSIS — I482 Chronic atrial fibrillation: Secondary | ICD-10-CM | POA: Diagnosis not present

## 2014-08-27 DIAGNOSIS — G8929 Other chronic pain: Secondary | ICD-10-CM | POA: Diagnosis not present

## 2014-08-27 DIAGNOSIS — I779 Disorder of arteries and arterioles, unspecified: Secondary | ICD-10-CM | POA: Diagnosis not present

## 2014-08-27 DIAGNOSIS — L97821 Non-pressure chronic ulcer of other part of left lower leg limited to breakdown of skin: Secondary | ICD-10-CM | POA: Diagnosis not present

## 2014-08-27 DIAGNOSIS — Z951 Presence of aortocoronary bypass graft: Secondary | ICD-10-CM | POA: Diagnosis not present

## 2014-08-27 DIAGNOSIS — Z7901 Long term (current) use of anticoagulants: Secondary | ICD-10-CM | POA: Diagnosis not present

## 2014-08-27 DIAGNOSIS — L97811 Non-pressure chronic ulcer of other part of right lower leg limited to breakdown of skin: Secondary | ICD-10-CM | POA: Diagnosis not present

## 2014-09-03 DIAGNOSIS — L97911 Non-pressure chronic ulcer of unspecified part of right lower leg limited to breakdown of skin: Secondary | ICD-10-CM | POA: Diagnosis not present

## 2014-09-03 DIAGNOSIS — L97921 Non-pressure chronic ulcer of unspecified part of left lower leg limited to breakdown of skin: Secondary | ICD-10-CM | POA: Diagnosis not present

## 2014-09-03 DIAGNOSIS — I890 Lymphedema, not elsewhere classified: Secondary | ICD-10-CM | POA: Diagnosis not present

## 2014-09-03 DIAGNOSIS — I872 Venous insufficiency (chronic) (peripheral): Secondary | ICD-10-CM | POA: Diagnosis not present

## 2014-09-03 DIAGNOSIS — I779 Disorder of arteries and arterioles, unspecified: Secondary | ICD-10-CM | POA: Diagnosis not present

## 2014-09-10 DIAGNOSIS — L97921 Non-pressure chronic ulcer of unspecified part of left lower leg limited to breakdown of skin: Secondary | ICD-10-CM | POA: Diagnosis not present

## 2014-09-10 DIAGNOSIS — I890 Lymphedema, not elsewhere classified: Secondary | ICD-10-CM | POA: Diagnosis not present

## 2014-09-10 DIAGNOSIS — I872 Venous insufficiency (chronic) (peripheral): Secondary | ICD-10-CM | POA: Diagnosis not present

## 2014-09-10 DIAGNOSIS — I779 Disorder of arteries and arterioles, unspecified: Secondary | ICD-10-CM | POA: Diagnosis not present

## 2014-09-10 DIAGNOSIS — L97911 Non-pressure chronic ulcer of unspecified part of right lower leg limited to breakdown of skin: Secondary | ICD-10-CM | POA: Diagnosis not present

## 2014-09-17 DIAGNOSIS — I482 Chronic atrial fibrillation: Secondary | ICD-10-CM | POA: Diagnosis not present

## 2014-09-17 DIAGNOSIS — L97911 Non-pressure chronic ulcer of unspecified part of right lower leg limited to breakdown of skin: Secondary | ICD-10-CM | POA: Diagnosis not present

## 2014-09-17 DIAGNOSIS — I872 Venous insufficiency (chronic) (peripheral): Secondary | ICD-10-CM | POA: Diagnosis not present

## 2014-09-17 DIAGNOSIS — Z7901 Long term (current) use of anticoagulants: Secondary | ICD-10-CM | POA: Diagnosis not present

## 2014-09-17 DIAGNOSIS — I890 Lymphedema, not elsewhere classified: Secondary | ICD-10-CM | POA: Diagnosis not present

## 2014-09-17 DIAGNOSIS — I779 Disorder of arteries and arterioles, unspecified: Secondary | ICD-10-CM | POA: Diagnosis not present

## 2014-09-17 DIAGNOSIS — L97921 Non-pressure chronic ulcer of unspecified part of left lower leg limited to breakdown of skin: Secondary | ICD-10-CM | POA: Diagnosis not present

## 2014-09-24 DIAGNOSIS — I890 Lymphedema, not elsewhere classified: Secondary | ICD-10-CM | POA: Diagnosis not present

## 2014-09-24 DIAGNOSIS — I779 Disorder of arteries and arterioles, unspecified: Secondary | ICD-10-CM | POA: Diagnosis not present

## 2014-09-24 DIAGNOSIS — L97921 Non-pressure chronic ulcer of unspecified part of left lower leg limited to breakdown of skin: Secondary | ICD-10-CM | POA: Diagnosis not present

## 2014-09-24 DIAGNOSIS — I872 Venous insufficiency (chronic) (peripheral): Secondary | ICD-10-CM | POA: Diagnosis not present

## 2014-09-24 DIAGNOSIS — L97911 Non-pressure chronic ulcer of unspecified part of right lower leg limited to breakdown of skin: Secondary | ICD-10-CM | POA: Diagnosis not present

## 2014-09-24 DIAGNOSIS — I482 Chronic atrial fibrillation: Secondary | ICD-10-CM | POA: Diagnosis not present

## 2014-09-24 DIAGNOSIS — Z7901 Long term (current) use of anticoagulants: Secondary | ICD-10-CM | POA: Diagnosis not present

## 2014-09-30 DIAGNOSIS — L97911 Non-pressure chronic ulcer of unspecified part of right lower leg limited to breakdown of skin: Secondary | ICD-10-CM | POA: Diagnosis not present

## 2014-09-30 DIAGNOSIS — I872 Venous insufficiency (chronic) (peripheral): Secondary | ICD-10-CM | POA: Diagnosis not present

## 2014-09-30 DIAGNOSIS — L97921 Non-pressure chronic ulcer of unspecified part of left lower leg limited to breakdown of skin: Secondary | ICD-10-CM | POA: Diagnosis not present

## 2014-09-30 DIAGNOSIS — I779 Disorder of arteries and arterioles, unspecified: Secondary | ICD-10-CM | POA: Diagnosis not present

## 2014-09-30 DIAGNOSIS — I890 Lymphedema, not elsewhere classified: Secondary | ICD-10-CM | POA: Diagnosis not present

## 2014-09-30 DIAGNOSIS — L97922 Non-pressure chronic ulcer of unspecified part of left lower leg with fat layer exposed: Secondary | ICD-10-CM | POA: Diagnosis not present

## 2014-09-30 DIAGNOSIS — L97912 Non-pressure chronic ulcer of unspecified part of right lower leg with fat layer exposed: Secondary | ICD-10-CM | POA: Diagnosis not present

## 2014-10-03 DIAGNOSIS — Z7901 Long term (current) use of anticoagulants: Secondary | ICD-10-CM | POA: Diagnosis not present

## 2014-10-08 DIAGNOSIS — I890 Lymphedema, not elsewhere classified: Secondary | ICD-10-CM | POA: Diagnosis not present

## 2014-10-08 DIAGNOSIS — I482 Chronic atrial fibrillation: Secondary | ICD-10-CM | POA: Diagnosis not present

## 2014-10-08 DIAGNOSIS — I872 Venous insufficiency (chronic) (peripheral): Secondary | ICD-10-CM | POA: Diagnosis not present

## 2014-10-08 DIAGNOSIS — Z7901 Long term (current) use of anticoagulants: Secondary | ICD-10-CM | POA: Diagnosis not present

## 2014-10-08 DIAGNOSIS — L97921 Non-pressure chronic ulcer of unspecified part of left lower leg limited to breakdown of skin: Secondary | ICD-10-CM | POA: Diagnosis not present

## 2014-10-08 DIAGNOSIS — I779 Disorder of arteries and arterioles, unspecified: Secondary | ICD-10-CM | POA: Diagnosis not present

## 2014-10-08 DIAGNOSIS — L97911 Non-pressure chronic ulcer of unspecified part of right lower leg limited to breakdown of skin: Secondary | ICD-10-CM | POA: Diagnosis not present

## 2014-10-11 DIAGNOSIS — L97909 Non-pressure chronic ulcer of unspecified part of unspecified lower leg with unspecified severity: Secondary | ICD-10-CM | POA: Diagnosis not present

## 2014-10-15 DIAGNOSIS — I482 Chronic atrial fibrillation: Secondary | ICD-10-CM | POA: Diagnosis not present

## 2014-10-15 DIAGNOSIS — I779 Disorder of arteries and arterioles, unspecified: Secondary | ICD-10-CM | POA: Diagnosis not present

## 2014-10-15 DIAGNOSIS — L97911 Non-pressure chronic ulcer of unspecified part of right lower leg limited to breakdown of skin: Secondary | ICD-10-CM | POA: Diagnosis not present

## 2014-10-15 DIAGNOSIS — I890 Lymphedema, not elsewhere classified: Secondary | ICD-10-CM | POA: Diagnosis not present

## 2014-10-15 DIAGNOSIS — Z951 Presence of aortocoronary bypass graft: Secondary | ICD-10-CM | POA: Diagnosis not present

## 2014-10-15 DIAGNOSIS — Z7901 Long term (current) use of anticoagulants: Secondary | ICD-10-CM | POA: Diagnosis not present

## 2014-10-15 DIAGNOSIS — L97921 Non-pressure chronic ulcer of unspecified part of left lower leg limited to breakdown of skin: Secondary | ICD-10-CM | POA: Diagnosis not present

## 2014-10-15 DIAGNOSIS — M549 Dorsalgia, unspecified: Secondary | ICD-10-CM | POA: Diagnosis not present

## 2014-10-15 DIAGNOSIS — I872 Venous insufficiency (chronic) (peripheral): Secondary | ICD-10-CM | POA: Diagnosis not present

## 2014-10-15 DIAGNOSIS — G8929 Other chronic pain: Secondary | ICD-10-CM | POA: Diagnosis not present

## 2014-10-29 DIAGNOSIS — L97921 Non-pressure chronic ulcer of unspecified part of left lower leg limited to breakdown of skin: Secondary | ICD-10-CM | POA: Diagnosis not present

## 2014-10-29 DIAGNOSIS — I890 Lymphedema, not elsewhere classified: Secondary | ICD-10-CM | POA: Diagnosis not present

## 2014-10-29 DIAGNOSIS — L97909 Non-pressure chronic ulcer of unspecified part of unspecified lower leg with unspecified severity: Secondary | ICD-10-CM | POA: Diagnosis not present

## 2014-10-29 DIAGNOSIS — I872 Venous insufficiency (chronic) (peripheral): Secondary | ICD-10-CM | POA: Diagnosis not present

## 2014-10-29 DIAGNOSIS — Z7901 Long term (current) use of anticoagulants: Secondary | ICD-10-CM | POA: Diagnosis not present

## 2014-10-29 DIAGNOSIS — I4891 Unspecified atrial fibrillation: Secondary | ICD-10-CM | POA: Diagnosis not present

## 2014-10-29 DIAGNOSIS — L97911 Non-pressure chronic ulcer of unspecified part of right lower leg limited to breakdown of skin: Secondary | ICD-10-CM | POA: Diagnosis not present

## 2014-10-29 DIAGNOSIS — I779 Disorder of arteries and arterioles, unspecified: Secondary | ICD-10-CM | POA: Diagnosis not present

## 2014-10-31 DIAGNOSIS — Z7901 Long term (current) use of anticoagulants: Secondary | ICD-10-CM | POA: Diagnosis not present

## 2014-11-07 DIAGNOSIS — Z0000 Encounter for general adult medical examination without abnormal findings: Secondary | ICD-10-CM | POA: Diagnosis not present

## 2014-11-12 DIAGNOSIS — L97921 Non-pressure chronic ulcer of unspecified part of left lower leg limited to breakdown of skin: Secondary | ICD-10-CM | POA: Diagnosis not present

## 2014-11-12 DIAGNOSIS — Z7901 Long term (current) use of anticoagulants: Secondary | ICD-10-CM | POA: Diagnosis not present

## 2014-11-12 DIAGNOSIS — I9589 Other hypotension: Secondary | ICD-10-CM | POA: Diagnosis not present

## 2014-11-12 DIAGNOSIS — I872 Venous insufficiency (chronic) (peripheral): Secondary | ICD-10-CM | POA: Diagnosis not present

## 2014-11-12 DIAGNOSIS — I779 Disorder of arteries and arterioles, unspecified: Secondary | ICD-10-CM | POA: Diagnosis not present

## 2014-11-12 DIAGNOSIS — L97911 Non-pressure chronic ulcer of unspecified part of right lower leg limited to breakdown of skin: Secondary | ICD-10-CM | POA: Diagnosis not present

## 2014-11-12 DIAGNOSIS — I482 Chronic atrial fibrillation: Secondary | ICD-10-CM | POA: Diagnosis not present

## 2014-11-12 DIAGNOSIS — I890 Lymphedema, not elsewhere classified: Secondary | ICD-10-CM | POA: Diagnosis not present

## 2014-11-12 DIAGNOSIS — E785 Hyperlipidemia, unspecified: Secondary | ICD-10-CM | POA: Diagnosis not present

## 2014-11-27 DIAGNOSIS — Z7901 Long term (current) use of anticoagulants: Secondary | ICD-10-CM | POA: Diagnosis not present

## 2014-11-28 DIAGNOSIS — I4891 Unspecified atrial fibrillation: Secondary | ICD-10-CM | POA: Diagnosis not present

## 2014-11-28 DIAGNOSIS — R319 Hematuria, unspecified: Secondary | ICD-10-CM | POA: Diagnosis not present

## 2014-11-28 DIAGNOSIS — L03039 Cellulitis of unspecified toe: Secondary | ICD-10-CM | POA: Diagnosis not present

## 2014-11-28 DIAGNOSIS — I739 Peripheral vascular disease, unspecified: Secondary | ICD-10-CM | POA: Diagnosis not present

## 2015-01-07 DIAGNOSIS — Z7901 Long term (current) use of anticoagulants: Secondary | ICD-10-CM | POA: Diagnosis not present

## 2015-01-17 DIAGNOSIS — L97821 Non-pressure chronic ulcer of other part of left lower leg limited to breakdown of skin: Secondary | ICD-10-CM | POA: Diagnosis not present

## 2015-01-17 DIAGNOSIS — L97919 Non-pressure chronic ulcer of unspecified part of right lower leg with unspecified severity: Secondary | ICD-10-CM | POA: Diagnosis not present

## 2015-01-17 DIAGNOSIS — I779 Disorder of arteries and arterioles, unspecified: Secondary | ICD-10-CM | POA: Diagnosis not present

## 2015-01-17 DIAGNOSIS — L97929 Non-pressure chronic ulcer of unspecified part of left lower leg with unspecified severity: Secondary | ICD-10-CM | POA: Diagnosis not present

## 2015-01-17 DIAGNOSIS — Z7901 Long term (current) use of anticoagulants: Secondary | ICD-10-CM | POA: Diagnosis not present

## 2015-01-17 DIAGNOSIS — I872 Venous insufficiency (chronic) (peripheral): Secondary | ICD-10-CM | POA: Diagnosis not present

## 2015-01-17 DIAGNOSIS — I4891 Unspecified atrial fibrillation: Secondary | ICD-10-CM | POA: Diagnosis not present

## 2015-01-17 DIAGNOSIS — I890 Lymphedema, not elsewhere classified: Secondary | ICD-10-CM | POA: Diagnosis not present

## 2015-01-17 DIAGNOSIS — I739 Peripheral vascular disease, unspecified: Secondary | ICD-10-CM | POA: Diagnosis not present

## 2015-01-28 DIAGNOSIS — Z23 Encounter for immunization: Secondary | ICD-10-CM | POA: Diagnosis not present

## 2015-02-03 DIAGNOSIS — L570 Actinic keratosis: Secondary | ICD-10-CM | POA: Diagnosis not present

## 2015-02-10 DIAGNOSIS — H2513 Age-related nuclear cataract, bilateral: Secondary | ICD-10-CM | POA: Diagnosis not present

## 2015-02-20 ENCOUNTER — Encounter: Payer: Self-pay | Admitting: Cardiology

## 2015-02-20 ENCOUNTER — Ambulatory Visit (INDEPENDENT_AMBULATORY_CARE_PROVIDER_SITE_OTHER): Payer: Medicare Other | Admitting: Cardiology

## 2015-02-20 VITALS — BP 126/80 | HR 54 | Ht 59.0 in | Wt 104.1 lb

## 2015-02-20 DIAGNOSIS — Z954 Presence of other heart-valve replacement: Secondary | ICD-10-CM

## 2015-02-20 DIAGNOSIS — I77810 Thoracic aortic ectasia: Secondary | ICD-10-CM

## 2015-02-20 DIAGNOSIS — I739 Peripheral vascular disease, unspecified: Secondary | ICD-10-CM

## 2015-02-20 DIAGNOSIS — I480 Paroxysmal atrial fibrillation: Secondary | ICD-10-CM

## 2015-02-20 DIAGNOSIS — I4891 Unspecified atrial fibrillation: Secondary | ICD-10-CM | POA: Diagnosis not present

## 2015-02-20 DIAGNOSIS — Z953 Presence of xenogenic heart valve: Secondary | ICD-10-CM

## 2015-02-20 DIAGNOSIS — I700 Atherosclerosis of aorta: Secondary | ICD-10-CM

## 2015-02-20 NOTE — Progress Notes (Signed)
[facility]. [address] [place], [place]  [address] Phone: [phone] Fax:  [phone]  Date:  02/20/2015   ID:  Reberta Weitzel, DOB 1923/02/20, MRN [id number]  PCP:  Criselda Peaches, MD   History of Present Illness: [name] is a 81 y.o. female here for CAD, edema follow up.  In July of 2005 she underwent aortic valve replacement with single vessel bypass in [place]. Bioprosthetic aortic valve. She also has peripheral vascular disease with left SFA stent placed in [place] in 2005. She is currently following with Dr. Scot Dock. Kellie Simmering in future according to daughter.   Kellie Simmering referred to [facility]. Dr. Zigmund Daniel wound center. Used pressure boots daily, legs wrapped. They're very pleased with her progress.  Several years ago she was diagnosed with atrial fibrillation. She is currently on chronic anticoagulation followed by Dr. Nyoka Cowden. She has been maintained on sotalol currently very low-dose 40 mg twice a day. QT interval normal.  Last EKG demonstrated sinus rhythm.  Daughter is friends with Dr. Ron Parker, she is wife of local retired Paediatric nurse.   [place]. Also has compression fractures of vertebral bodies. Chronic back pain.   Wt Readings from Last 3 Encounters:  02/20/15 104 lb 1.9 oz (47.229 kg)  08/15/14 106 lb (48.081 kg)  04/16/14 110 lb (49.896 kg)     Past Medical History  Diagnosis Date  . Atrial fibrillation ([place])   . PUD (peptic ulcer disease)   . Coronary artery disease   . Thyroid disease     Hypothyroidism  . DJD (degenerative joint disease)   . DJD (degenerative joint disease)   . Hyperlipidemia   . Osteoporosis   . Thoracic aortic aneurysm ([place])   . Colon polyps   . Kidney stone     Past Surgical History  Procedure Laterality Date  . Cardiac valve replacement      aortic valve  . Renal artery stent    . Aortic valve replacement  03/29/2003  . Coronary artery bypass graft  03/29/2003  . Femoral artery stent Left 04/2003     Current Outpatient Prescriptions  Medication Sig Dispense Refill  . aspirin 81 MG tablet Take 81 mg by mouth every other day.    . Calcium Carbonate-Vitamin D (CALCIUM 600+D) 600-200 MG-UNIT TABS Take 2 tablets by mouth daily.    Mariane Baumgarten Calcium (STOOL SOFTENER PO) Take by mouth as needed.    . furosemide (LASIX) 20 MG tablet Take 20 mg by mouth daily.     . Hydrocodone-Acetaminophen 5-300 MG TABS Take 5-300 mg by mouth 4 (four) times daily.     Marland Kitchen levothyroxine (SYNTHROID, LEVOTHROID) 88 MCG tablet Take 88 mcg by mouth daily.    . predniSONE (DELTASONE) 5 MG tablet Take 5 mg by mouth daily with breakfast.     . sotalol (BETAPACE) 80 MG tablet Take 40 mg by mouth 2 (two) times daily.    . temazepam (RESTORIL) 15 MG capsule Take 15 mg by mouth at bedtime.    Marland Kitchen warfarin (COUMADIN) 2.5 MG tablet Take 2.5 mg by mouth See admin instructions. Take 2.5 by mouth everyday     No current facility-administered medications for this visit.    Allergies:    Allergies  Allergen Reactions  . Shrimp [Shellfish Allergy] Hives    Social History:  The patient  reports that she has never smoked. She has never used smokeless tobacco. She reports that she does not drink alcohol or use illicit  drugs.   Family History  Problem Relation Age of Onset  . Heart disease Mother   . Hypertension Mother   . Dementia Mother   . Heart disease Father   . Hypertension Father     Before age 55  . Heart attack Father     X ' s   2-3  . Heart disease Brother   . Hypertension Brother   . Diabetes Daughter   . Stroke Neg Hx     ROS:  Please see the history of present illness.   Denies any syncope, bleeding, orthopnea, PND. Positive for weeping lower extremities, improving. Occasional excoriations on skin.  All other systems reviewed and negative.   PHYSICAL EXAM: VS:  BP 126/80 mmHg  Pulse 54  Ht 4\' 11"  (1.499 m)  Wt 104 lb 1.9 oz (47.229 kg)  BMI 21.02 kg/m2  SpO2 94% Well nourished, well developed,  in no acute distress HEENT: normal, [place]/AT, EOMI Neck: no JVD, normal carotid upstroke, no bruit Cardiac:  normal S1, S2; RRR; 2/6 systolic right upper sternal border murmurPrior chest wall scar Lungs:  clear to auscultation bilaterally, no wheezing, rhonchi or rales Abd: soft, nontender, no hepatomegaly, no bruits Ext: compression hose in place B. Skin: warm and dry GU: deferred Neuro: no focal abnormalities noted, AAO x 3  EKG:  Today 08/15/14-sinus rhythm, sinus arrhythmia, left anterior fascicular block, left ventricular hypertrophy. 09/08/13-sinus rhythm, 67, PAC, QT interval 468 ms.  Echocardiogram: 12/06/13 - Left ventricle: The cavity size was normal. Wall thickness wasincreased in a pattern of moderate LVH. Systolic function wasnormal. The estimated ejection fraction was in the range of 55%to 60%. Wall motion was normal; there were no regional wallmotion abnormalities. Doppler parameters are consistent withabnormal left ventricular relaxation (grade 1 diastolicdysfunction). Doppler parameters are consistent with highventricular filling pressure. - Aortic valve: A bioprosthesis was present. Mean gradient 23mmHg. - Ascending aorta: The ascending aorta was moderately dilated. - Mitral valve: Calcified annulus. - Left atrium: The atrium was mildly dilated.  Labs: Creatinine 0.54, potassium 3.8, hemoglobin 13.1, INR 3.5     ASSESSMENT AND PLAN:  1. Lower extremity wounds/Weeping lower extremities-amazing progress since last time I saw her. She was seeing Dr. Zigmund Daniel at [facility]. They were very impressed and very thankful for the high quality of care that they received. Echocardiogram overall reassuring with normal EF however elevated left atrial filling pressures were noted.  Agree with continued Lasix is now being taken on as-needed basis, compression hose, salt limitation, fluid limitation, raising of lower extremities. Continuing with increased protein.   Vascular surgery  note reviewed. 2. Bioprosthetic aortic valve secondary to aortic valve stenosis- echocardiogram reassuring. Dental antibiotic prophylaxis. 3. Paroxysmal atrial fibrillation-currently sinus rhythm, low dose sotalol. We discussed the possibility of every 24 hours dosing however she is doing very well on very low dose split dosing at 40 mg twice a day. We will continue. 4. Dilated aortic root, ascending, 5.2 x 4.6 cm on CT scan. Measures 2.9 cm at the sinotubular junction we will continue to monitor. Would be high risk for surgical procedure. No evidence of dissection.  5. Coronary artery disease-single vessel bypass performed in 2005. Stable. 6. Aortic atherosclerosis-seen on CT scan 7. Chronic anticoagulation - coumadin. Dr. Nyoka Cowden.  8. I will see her back in 6 months.    Signed, Candee Furbish, MD [facility]  02/20/2015 3:24 PM

## 2015-02-20 NOTE — Patient Instructions (Signed)

## 2015-02-25 DIAGNOSIS — D485 Neoplasm of uncertain behavior of skin: Secondary | ICD-10-CM | POA: Diagnosis not present

## 2015-02-25 DIAGNOSIS — Z951 Presence of aortocoronary bypass graft: Secondary | ICD-10-CM | POA: Diagnosis not present

## 2015-02-25 DIAGNOSIS — I872 Venous insufficiency (chronic) (peripheral): Secondary | ICD-10-CM | POA: Diagnosis not present

## 2015-02-25 DIAGNOSIS — Z48812 Encounter for surgical aftercare following surgery on the circulatory system: Secondary | ICD-10-CM | POA: Diagnosis not present

## 2015-02-25 DIAGNOSIS — I779 Disorder of arteries and arterioles, unspecified: Secondary | ICD-10-CM | POA: Diagnosis not present

## 2015-02-25 DIAGNOSIS — Z5189 Encounter for other specified aftercare: Secondary | ICD-10-CM | POA: Diagnosis not present

## 2015-02-25 DIAGNOSIS — R600 Localized edema: Secondary | ICD-10-CM | POA: Diagnosis not present

## 2015-02-25 DIAGNOSIS — L97821 Non-pressure chronic ulcer of other part of left lower leg limited to breakdown of skin: Secondary | ICD-10-CM | POA: Diagnosis not present

## 2015-02-25 DIAGNOSIS — I482 Chronic atrial fibrillation: Secondary | ICD-10-CM | POA: Diagnosis not present

## 2015-02-25 DIAGNOSIS — D489 Neoplasm of uncertain behavior, unspecified: Secondary | ICD-10-CM | POA: Diagnosis not present

## 2015-02-25 DIAGNOSIS — I739 Peripheral vascular disease, unspecified: Secondary | ICD-10-CM | POA: Diagnosis not present

## 2015-02-25 DIAGNOSIS — Z7901 Long term (current) use of anticoagulants: Secondary | ICD-10-CM | POA: Diagnosis not present

## 2015-02-27 DIAGNOSIS — I739 Peripheral vascular disease, unspecified: Secondary | ICD-10-CM | POA: Diagnosis not present

## 2015-02-27 DIAGNOSIS — I2510 Atherosclerotic heart disease of native coronary artery without angina pectoris: Secondary | ICD-10-CM | POA: Diagnosis not present

## 2015-03-06 DIAGNOSIS — L97929 Non-pressure chronic ulcer of unspecified part of left lower leg with unspecified severity: Secondary | ICD-10-CM | POA: Diagnosis not present

## 2015-03-10 ENCOUNTER — Other Ambulatory Visit: Payer: Self-pay | Admitting: *Deleted

## 2015-03-10 DIAGNOSIS — I739 Peripheral vascular disease, unspecified: Secondary | ICD-10-CM

## 2015-03-12 ENCOUNTER — Encounter: Payer: Self-pay | Admitting: Vascular Surgery

## 2015-03-12 DIAGNOSIS — H40013 Open angle with borderline findings, low risk, bilateral: Secondary | ICD-10-CM | POA: Diagnosis not present

## 2015-03-12 DIAGNOSIS — H25813 Combined forms of age-related cataract, bilateral: Secondary | ICD-10-CM | POA: Diagnosis not present

## 2015-03-12 DIAGNOSIS — H04123 Dry eye syndrome of bilateral lacrimal glands: Secondary | ICD-10-CM | POA: Diagnosis not present

## 2015-03-18 ENCOUNTER — Ambulatory Visit (HOSPITAL_COMMUNITY)
Admission: RE | Admit: 2015-03-18 | Discharge: 2015-03-18 | Disposition: A | Discharge status: Home / Self Care | Payer: Medicare Other | Source: Ambulatory Visit | Attending: Vascular Surgery | Admitting: Vascular Surgery

## 2015-03-18 ENCOUNTER — Ambulatory Visit (INDEPENDENT_AMBULATORY_CARE_PROVIDER_SITE_OTHER)
Admission: RE | Admit: 2015-03-18 | Discharge: 2015-03-18 | Disposition: A | Discharge status: Home / Self Care | Payer: Medicare Other | Source: Ambulatory Visit | Attending: Vascular Surgery | Admitting: Vascular Surgery

## 2015-03-18 ENCOUNTER — Ambulatory Visit (INDEPENDENT_AMBULATORY_CARE_PROVIDER_SITE_OTHER): Payer: Medicare Other | Admitting: Vascular Surgery

## 2015-03-18 ENCOUNTER — Encounter: Payer: Self-pay | Admitting: Vascular Surgery

## 2015-03-18 VITALS — BP 128/81 | HR 61 | Ht 59.0 in | Wt 103.3 lb

## 2015-03-18 DIAGNOSIS — R938 Abnormal findings on diagnostic imaging of other specified body structures: Secondary | ICD-10-CM | POA: Diagnosis not present

## 2015-03-18 DIAGNOSIS — I739 Peripheral vascular disease, unspecified: Secondary | ICD-10-CM

## 2015-03-18 DIAGNOSIS — I70209 Unspecified atherosclerosis of native arteries of extremities, unspecified extremity: Secondary | ICD-10-CM | POA: Diagnosis not present

## 2015-03-18 DIAGNOSIS — E785 Hyperlipidemia, unspecified: Secondary | ICD-10-CM | POA: Diagnosis not present

## 2015-03-18 NOTE — Progress Notes (Addendum)
[facility] [place]  Patient name: [name] MRN: [id number] DOB: 04-21-1922 Sex: female  REASON FOR VISIT: Follow-up  HPI: [name] is a 81 y.o. female who returns for follow-up of her nonhealing ulcers of the bilateral lower extremities. She was last seen in the office on 04/16/2014 by Dr. Kellie Simmering. At that time it was felt that her ulcerations were due to a combination of arterial insufficiency and deep vein reflux. No intervention was recommended as she was not a reasonable operative candidate. She has had previous percutaneous intervention in the past. She was advised to get another opinion from [facility] wound care.   She returns today without any ulcerations. Her wounds have healed. She has had skin grafting at [facility]. She denies any significant pain or swelling in her legs. She does have some discomfort of her toes bilaterally, which she attributes to the "the pressure of the blankets on her." She does have a basal cell carcinoma of her left shin. This is not being treated due to fear of it not healing. Her son-in-law is a Paediatric nurse and is following this.   Past Medical History  Diagnosis Date  . Atrial fibrillation ([place])   . PUD (peptic ulcer disease)   . Coronary artery disease   . Thyroid disease     Hypothyroidism  . DJD (degenerative joint disease)   . DJD (degenerative joint disease)   . Hyperlipidemia   . Osteoporosis   . Thoracic aortic aneurysm ([place])   . Colon polyps   . Kidney stone     Family History  Problem Relation Age of Onset  . Heart disease Mother   . Hypertension Mother   . Dementia Mother   . Heart disease Father   . Hypertension Father     Before age 26  . Heart attack Father     X ' s   2-3  . Heart disease Brother   . Hypertension Brother   . Diabetes Daughter   . Stroke Neg Hx     SOCIAL HISTORY: Social History  Substance Use Topics  . Smoking status: Never Smoker   . Smokeless tobacco: Never Used  .  Alcohol Use: No    Allergies  Allergen Reactions  . Shrimp [Shellfish Allergy] Hives    Current Outpatient Prescriptions  Medication Sig Dispense Refill  . aspirin 81 MG tablet Take 81 mg by mouth every other day.    Mariane Baumgarten Calcium (STOOL SOFTENER PO) Take by mouth as needed.    . Hydrocodone-Acetaminophen 5-300 MG TABS Take 5-300 mg by mouth 4 (four) times daily.     Marland Kitchen levothyroxine (SYNTHROID, LEVOTHROID) 88 MCG tablet Take 88 mcg by mouth daily.    . predniSONE (DELTASONE) 5 MG tablet Take 5 mg by mouth daily with breakfast.     . sotalol (BETAPACE) 80 MG tablet Take 40 mg by mouth 2 (two) times daily.    . temazepam (RESTORIL) 15 MG capsule Take 15 mg by mouth at bedtime.    Marland Kitchen warfarin (COUMADIN) 2.5 MG tablet Take 2.5 mg by mouth See admin instructions. Take 2.5 by mouth everyday    . Calcium Carbonate-Vitamin D (CALCIUM 600+D) 600-200 MG-UNIT TABS Take 2 tablets by mouth daily. Reported on 03/18/2015    . furosemide (LASIX) 20 MG tablet Take 20 mg by mouth daily as needed. Reported on 03/18/2015     No current facility-administered medications for this visit.    REVIEW OF SYSTEMS:  [X]  denotes positive  finding, [ ]  denotes negative finding Cardiac  Comments:  Chest pain or chest pressure:    Shortness of breath upon exertion:    Short of breath when lying flat:    Irregular heart rhythm:        Vascular    Pain in calf, thigh, or hip brought on by ambulation: x   Pain in feet at night that wakes you up from your sleep:  x   Blood clot in your veins:    Leg swelling:  x       Pulmonary    Oxygen at home:    Productive cough:     Wheezing:         Neurologic    Sudden weakness in arms or legs:     Sudden numbness in arms or legs:     Sudden onset of difficulty speaking or slurred speech:    Temporary loss of vision in one eye:     Problems with dizziness:         Gastrointestinal    Blood in stool:     Vomited blood:         Genitourinary    Burning when  urinating:     Blood in urine: x microscopic      Psychiatric    Major depression:         Hematologic    Bleeding problems: x   Problems with blood clotting too easily: x       Skin    Rashes or ulcers:        Constitutional    Fever or chills:      PHYSICAL EXAM: Filed Vitals:   03/18/15 1418  BP: 128/81  Pulse: 61  Height: 4\' 11"  (1.499 m)  Weight: 103 lb 4.8 oz (46.857 kg)  SpO2: 97%    GENERAL: The patient is a well-nourished female, in no acute distress. The vital signs are documented above. VASCULAR: 3+ femoral pulses bilaterally. Brisk capillary refill in toes bilaterally.  PULMONARY: Nonlabored breathing. MUSCULOSKELETAL: There are no major deformities or cyanosis. Small wound to left shin consistent with basal cell carcinoma. No swelling of lower extremities. NEUROLOGIC: No focal weakness or paresthesias are detected. SKIN: Hemosiderin staining of bilateral lower extremities. PSYCHIATRIC: The patient has a normal affect.  DATA:  ABIs (03/18/2015) ABIs not obtained due to noncompressible vessels. Monophasic waveforms bilaterally. Left TBI 0.38.  Right TBI 0.34. 50-74% left mid superficial artery stenosis and 75-99% left mid/distal superficial femoral artery stenosis. No significant change when compared to exam on 03/12/2014.   MEDICAL ISSUES:  Peripheral vascular disease Chronic venous insufficiency  The patient is doing well. The pressure sores of her posterior calves have healed. She had been going to the wound center at [facility] where she underwent skin grafting. She has some mild venous reflux bilaterally. She is wearing EdemaWear stockings for swelling. Her noninvasive studies today are stable compared to last year's studies. The suspected basal cell carcinoma of her left This suspected basal cell carcinoma of her left shin is being followed by dermatology. There are currently no plans to excise this due to concern of the wounds not healing. She will  follow up with [place] on an as-needed basis.  Virgina Jock, PA-C [facility]   agree with above assessment  fortunately skin grafting has healed ulcerations and posterior calf areas bilaterally  arterial circulation seems stable from last year's evaluation with some stenosis in left SFA stent  Both  feet are pink and well perfused with rapid capillary refill   Family apparently has decided not to remove basal cell from left pretibial region over concerned about healing and this will be followed closely by dermatology Return to see [place] on when necessary basis

## 2015-03-25 DIAGNOSIS — R791 Abnormal coagulation profile: Secondary | ICD-10-CM | POA: Diagnosis not present

## 2015-03-31 MED FILL — HYDROCODON-APAP 5-325: 5-325 | 40 days supply | Qty: 120 | Fill #0

## 2015-04-04 MED FILL — predniSONE 5 MG TABS: 5 | 90 days supply | Qty: 90 | Fill #3

## 2015-04-14 DIAGNOSIS — I739 Peripheral vascular disease, unspecified: Secondary | ICD-10-CM | POA: Diagnosis not present

## 2015-04-14 DIAGNOSIS — I482 Chronic atrial fibrillation: Secondary | ICD-10-CM | POA: Diagnosis not present

## 2015-04-14 DIAGNOSIS — I779 Disorder of arteries and arterioles, unspecified: Secondary | ICD-10-CM | POA: Diagnosis not present

## 2015-04-14 DIAGNOSIS — S81802D Unspecified open wound, left lower leg, subsequent encounter: Secondary | ICD-10-CM | POA: Diagnosis not present

## 2015-04-14 DIAGNOSIS — I872 Venous insufficiency (chronic) (peripheral): Secondary | ICD-10-CM | POA: Diagnosis not present

## 2015-04-14 DIAGNOSIS — D489 Neoplasm of uncertain behavior, unspecified: Secondary | ICD-10-CM | POA: Diagnosis not present

## 2015-04-14 DIAGNOSIS — Z7901 Long term (current) use of anticoagulants: Secondary | ICD-10-CM | POA: Diagnosis not present

## 2015-04-14 DIAGNOSIS — I890 Lymphedema, not elsewhere classified: Secondary | ICD-10-CM | POA: Diagnosis not present

## 2015-04-14 DIAGNOSIS — Z951 Presence of aortocoronary bypass graft: Secondary | ICD-10-CM | POA: Diagnosis not present

## 2015-04-17 DIAGNOSIS — H2512 Age-related nuclear cataract, left eye: Secondary | ICD-10-CM | POA: Diagnosis not present

## 2015-04-17 DIAGNOSIS — H268 Other specified cataract: Secondary | ICD-10-CM | POA: Diagnosis not present

## 2015-04-22 DIAGNOSIS — L97929 Non-pressure chronic ulcer of unspecified part of left lower leg with unspecified severity: Secondary | ICD-10-CM | POA: Diagnosis not present

## 2015-04-22 DIAGNOSIS — Z7901 Long term (current) use of anticoagulants: Secondary | ICD-10-CM | POA: Diagnosis not present

## 2015-05-14 MED FILL — HYDROCODON-APAP 5-325: 5-325 | 40 days supply | Qty: 120 | Fill #0

## 2015-05-14 MED FILL — WARFARIN SODIUM 2.5 MG TAB: 2.5 | 84 days supply | Qty: 108 | Fill #0

## 2015-05-21 DIAGNOSIS — Z7901 Long term (current) use of anticoagulants: Secondary | ICD-10-CM | POA: Diagnosis not present

## 2015-05-28 DIAGNOSIS — I890 Lymphedema, not elsewhere classified: Secondary | ICD-10-CM | POA: Diagnosis not present

## 2015-05-28 DIAGNOSIS — Z7901 Long term (current) use of anticoagulants: Secondary | ICD-10-CM | POA: Diagnosis not present

## 2015-05-28 DIAGNOSIS — S81802D Unspecified open wound, left lower leg, subsequent encounter: Secondary | ICD-10-CM | POA: Diagnosis not present

## 2015-05-28 DIAGNOSIS — Z951 Presence of aortocoronary bypass graft: Secondary | ICD-10-CM | POA: Diagnosis not present

## 2015-05-28 DIAGNOSIS — I482 Chronic atrial fibrillation: Secondary | ICD-10-CM | POA: Diagnosis not present

## 2015-05-28 DIAGNOSIS — I739 Peripheral vascular disease, unspecified: Secondary | ICD-10-CM | POA: Diagnosis not present

## 2015-05-28 DIAGNOSIS — D489 Neoplasm of uncertain behavior, unspecified: Secondary | ICD-10-CM | POA: Diagnosis not present

## 2015-05-28 DIAGNOSIS — I779 Disorder of arteries and arterioles, unspecified: Secondary | ICD-10-CM | POA: Diagnosis not present

## 2015-05-28 DIAGNOSIS — I872 Venous insufficiency (chronic) (peripheral): Secondary | ICD-10-CM | POA: Diagnosis not present

## 2015-06-02 DIAGNOSIS — J019 Acute sinusitis, unspecified: Secondary | ICD-10-CM | POA: Diagnosis not present

## 2015-06-02 MED FILL — AMOX TR-K CLV 875-125 MG TA: 875-125 | 5 days supply | Qty: 10 | Fill #0

## 2015-06-23 DIAGNOSIS — H2511 Age-related nuclear cataract, right eye: Secondary | ICD-10-CM | POA: Diagnosis not present

## 2015-06-24 MED FILL — HYDROCODON-APAP 5-325: 5-325 | 40 days supply | Qty: 120 | Fill #0

## 2015-06-26 DIAGNOSIS — H2511 Age-related nuclear cataract, right eye: Secondary | ICD-10-CM | POA: Diagnosis not present

## 2015-07-02 DIAGNOSIS — R791 Abnormal coagulation profile: Secondary | ICD-10-CM | POA: Diagnosis not present

## 2015-07-02 MED FILL — predniSONE 5 MG TABS: 5 | 90 days supply | Qty: 90 | Fill #4

## 2015-07-03 DIAGNOSIS — J019 Acute sinusitis, unspecified: Secondary | ICD-10-CM | POA: Diagnosis not present

## 2015-07-03 MED FILL — AZITHROMYCIN 250 MG TABLET: 250 | 30 days supply | Qty: 30 | Fill #0

## 2015-07-16 DIAGNOSIS — R791 Abnormal coagulation profile: Secondary | ICD-10-CM | POA: Diagnosis not present

## 2015-07-22 DIAGNOSIS — L97811 Non-pressure chronic ulcer of other part of right lower leg limited to breakdown of skin: Secondary | ICD-10-CM | POA: Diagnosis not present

## 2015-07-22 DIAGNOSIS — Z7901 Long term (current) use of anticoagulants: Secondary | ICD-10-CM | POA: Diagnosis not present

## 2015-07-22 DIAGNOSIS — D489 Neoplasm of uncertain behavior, unspecified: Secondary | ICD-10-CM | POA: Diagnosis not present

## 2015-07-22 DIAGNOSIS — I83028 Varicose veins of left lower extremity with ulcer other part of lower leg: Secondary | ICD-10-CM | POA: Diagnosis not present

## 2015-07-22 DIAGNOSIS — I4891 Unspecified atrial fibrillation: Secondary | ICD-10-CM | POA: Diagnosis not present

## 2015-07-22 DIAGNOSIS — I872 Venous insufficiency (chronic) (peripheral): Secondary | ICD-10-CM | POA: Diagnosis not present

## 2015-07-22 DIAGNOSIS — I482 Chronic atrial fibrillation: Secondary | ICD-10-CM | POA: Diagnosis not present

## 2015-07-22 DIAGNOSIS — S81802A Unspecified open wound, left lower leg, initial encounter: Secondary | ICD-10-CM | POA: Diagnosis not present

## 2015-07-23 DIAGNOSIS — L97929 Non-pressure chronic ulcer of unspecified part of left lower leg with unspecified severity: Secondary | ICD-10-CM | POA: Diagnosis not present

## 2015-07-29 DIAGNOSIS — J328 Other chronic sinusitis: Secondary | ICD-10-CM | POA: Diagnosis not present

## 2015-07-29 DIAGNOSIS — I2510 Atherosclerotic heart disease of native coronary artery without angina pectoris: Secondary | ICD-10-CM | POA: Diagnosis not present

## 2015-07-29 DIAGNOSIS — L97929 Non-pressure chronic ulcer of unspecified part of left lower leg with unspecified severity: Secondary | ICD-10-CM | POA: Diagnosis not present

## 2015-07-29 DIAGNOSIS — I4891 Unspecified atrial fibrillation: Secondary | ICD-10-CM | POA: Diagnosis not present

## 2015-08-05 MED FILL — HYDROCODON-APAP 5-325: 5-325 | 40 days supply | Qty: 120 | Fill #0

## 2015-08-06 DIAGNOSIS — I890 Lymphedema, not elsewhere classified: Secondary | ICD-10-CM | POA: Diagnosis not present

## 2015-08-06 DIAGNOSIS — D489 Neoplasm of uncertain behavior, unspecified: Secondary | ICD-10-CM | POA: Diagnosis not present

## 2015-08-06 DIAGNOSIS — L97821 Non-pressure chronic ulcer of other part of left lower leg limited to breakdown of skin: Secondary | ICD-10-CM | POA: Diagnosis not present

## 2015-08-06 DIAGNOSIS — Z951 Presence of aortocoronary bypass graft: Secondary | ICD-10-CM | POA: Diagnosis not present

## 2015-08-06 DIAGNOSIS — I482 Chronic atrial fibrillation: Secondary | ICD-10-CM | POA: Diagnosis not present

## 2015-08-06 DIAGNOSIS — S81801D Unspecified open wound, right lower leg, subsequent encounter: Secondary | ICD-10-CM | POA: Diagnosis not present

## 2015-08-06 DIAGNOSIS — I872 Venous insufficiency (chronic) (peripheral): Secondary | ICD-10-CM | POA: Diagnosis not present

## 2015-08-06 DIAGNOSIS — I83028 Varicose veins of left lower extremity with ulcer other part of lower leg: Secondary | ICD-10-CM | POA: Diagnosis not present

## 2015-08-06 DIAGNOSIS — S81802D Unspecified open wound, left lower leg, subsequent encounter: Secondary | ICD-10-CM | POA: Diagnosis not present

## 2015-08-06 DIAGNOSIS — Z7901 Long term (current) use of anticoagulants: Secondary | ICD-10-CM | POA: Diagnosis not present

## 2015-08-19 DIAGNOSIS — I890 Lymphedema, not elsewhere classified: Secondary | ICD-10-CM | POA: Diagnosis not present

## 2015-08-19 DIAGNOSIS — D489 Neoplasm of uncertain behavior, unspecified: Secondary | ICD-10-CM | POA: Diagnosis not present

## 2015-08-19 DIAGNOSIS — I872 Venous insufficiency (chronic) (peripheral): Secondary | ICD-10-CM | POA: Diagnosis not present

## 2015-08-19 DIAGNOSIS — I482 Chronic atrial fibrillation: Secondary | ICD-10-CM | POA: Diagnosis not present

## 2015-08-19 DIAGNOSIS — Z7901 Long term (current) use of anticoagulants: Secondary | ICD-10-CM | POA: Diagnosis not present

## 2015-08-19 DIAGNOSIS — L97922 Non-pressure chronic ulcer of unspecified part of left lower leg with fat layer exposed: Secondary | ICD-10-CM | POA: Diagnosis not present

## 2015-08-19 DIAGNOSIS — R600 Localized edema: Secondary | ICD-10-CM | POA: Diagnosis not present

## 2015-08-19 DIAGNOSIS — I779 Disorder of arteries and arterioles, unspecified: Secondary | ICD-10-CM | POA: Diagnosis not present

## 2015-08-19 MED FILL — WARFARIN SODIUM 2.5 MG TAB: 2.5 | 84 days supply | Qty: 108 | Fill #1

## 2015-08-20 ENCOUNTER — Encounter: Payer: Self-pay | Admitting: Cardiology

## 2015-08-20 ENCOUNTER — Ambulatory Visit (INDEPENDENT_AMBULATORY_CARE_PROVIDER_SITE_OTHER): Payer: Medicare Other | Admitting: Cardiology

## 2015-08-20 VITALS — BP 128/84 | HR 58 | Ht <= 58 in | Wt 104.1 lb

## 2015-08-20 DIAGNOSIS — R0600 Dyspnea, unspecified: Secondary | ICD-10-CM

## 2015-08-20 DIAGNOSIS — Z953 Presence of xenogenic heart valve: Secondary | ICD-10-CM

## 2015-08-20 DIAGNOSIS — I480 Paroxysmal atrial fibrillation: Secondary | ICD-10-CM

## 2015-08-20 DIAGNOSIS — Z954 Presence of other heart-valve replacement: Secondary | ICD-10-CM | POA: Diagnosis not present

## 2015-08-20 NOTE — Patient Instructions (Signed)
**Note De-identified [name] Obfuscation** Medication Instructions:  Same-no changes  Labwork: None  Testing/Procedures: Your physician has requested that you have an echocardiogram. Echocardiography is a painless test that uses sound waves to create images of your heart. It provides your doctor with information about the size and shape of your heart and how well your heart's chambers and valves are working. This procedure takes approximately one hour. There are no restrictions for this procedure.   Follow-Up: Your physician wants you to follow-up in: 6 months. You will receive a reminder letter in the mail two months in advance. If you don't receive a letter, please call our office to schedule the follow-up appointment.     If you need a refill on your cardiac medications before your next appointment, please call your pharmacy.   

## 2015-08-20 NOTE — Progress Notes (Signed)
[facility]. [address] [place],   [address] Phone: [phone] Fax:  [phone]  Date:  08/20/2015   ID:  [name], DOB [birth year], MRN [id number]  PCP:  Criselda Peaches, MD   History of Present Illness: [name] is a 82 y.o. female here for CAD, edema follow up.  In July of 2005 she underwent aortic valve replacement with single vessel bypass in [place]. Bioprosthetic aortic valve. She also has peripheral vascular disease with left SFA stent placed in [place] in 2005. She is currently following with Dr. Scot Dock. Kellie Simmering in future according to daughter.   Kellie Simmering referred to [facility]. Dr. Zigmund Daniel wound center. Used pressure boots daily, legs wrapped. They're very pleased with her progress.  Several years ago she was diagnosed with atrial fibrillation. She is currently on chronic anticoagulation followed by Dr. Nyoka Cowden. She has been maintained on sotalol currently very low-dose 40 mg twice a day. QT interval normal.  She has noted some mild increased shortness of breath, trouble getting air out. No chest pain.  Last EKG demonstrated sinus rhythm.  Daughter is friends with Dr. Ron Parker, she is wife of local retired Paediatric nurse.   [place]. Also has compression fractures of vertebral bodies. Chronic back pain.   Wt Readings from Last 3 Encounters:  08/20/15 104 lb 1.9 oz (47.229 kg)  03/18/15 103 lb 4.8 oz (46.857 kg)  02/20/15 104 lb 1.9 oz (47.229 kg)     Past Medical History  Diagnosis Date  . Atrial fibrillation ([place])   . PUD (peptic ulcer disease)   . Coronary artery disease   . Thyroid disease     Hypothyroidism  . DJD (degenerative joint disease)   . DJD (degenerative joint disease)   . Hyperlipidemia   . Osteoporosis   . Thoracic aortic aneurysm ([place])   . Colon polyps   . Kidney stone     Past Surgical History  Procedure Laterality Date  . Cardiac valve replacement      aortic valve  . Renal artery stent    . Aortic valve  replacement  03/29/2003  . Coronary artery bypass graft  03/29/2003  . Femoral artery stent Left 04/2003    Current Outpatient Prescriptions  Medication Sig Dispense Refill  . aspirin 81 MG tablet Take 81 mg by mouth every other day.    . Calcium Carbonate-Vitamin D (CALCIUM 600+D) 600-200 MG-UNIT TABS Take 2 tablets by mouth daily. Reported on 03/18/2015    . Docusate Calcium (STOOL SOFTENER PO) Take by mouth as needed.    . Hydrocodone-Acetaminophen 5-300 MG TABS Take 5-300 mg by mouth 4 (four) times daily.     Marland Kitchen levothyroxine (SYNTHROID, LEVOTHROID) 88 MCG tablet Take 88 mcg by mouth daily.    . predniSONE (DELTASONE) 5 MG tablet Take 5 mg by mouth daily with breakfast.     . sotalol (BETAPACE) 80 MG tablet Take 40 mg by mouth 2 (two) times daily.    . temazepam (RESTORIL) 15 MG capsule Take 15 mg by mouth at bedtime.    Marland Kitchen warfarin (COUMADIN) 2.5 MG tablet Take 2.5 mg by mouth See admin instructions. Take 2.5 by mouth everyday     No current facility-administered medications for this visit.    Allergies:    Allergies  Allergen Reactions  . Shrimp [Shellfish Allergy] Hives    Social History:  The patient  reports that she has never smoked. She has never used smokeless tobacco. She reports that  she does not drink alcohol or use illicit drugs.   Family History  Problem Relation Age of Onset  . Heart disease Mother   . Hypertension Mother   . Dementia Mother   . Heart disease Father   . Hypertension Father     Before age 32  . Heart attack Father     X ' s   2-3  . Heart disease Brother   . Hypertension Brother   . Diabetes Daughter   . Stroke Neg Hx     ROS:  Please see the history of present illness.   Denies any syncope, bleeding, orthopnea, PND. Positive for weeping lower extremities, improving. Occasional excoriations on skin.  All other systems reviewed and negative.   PHYSICAL EXAM: VS:  BP 128/84 mmHg  Pulse 58  Ht 4\' 9"  (1.448 m)  Wt 104 lb 1.9 oz (47.229 kg)   BMI 22.53 kg/m2 Well nourished, well developed, in no acute distress HEENT: normal, [place]/AT, EOMI Neck: no JVD, normal carotid upstroke, no bruit Cardiac:  normal S1, S2; RRR; 2/6 systolic right upper sternal border murmurPrior chest wall scar Lungs:  clear to auscultation bilaterally, no wheezing, rhonchi or rales Abd: soft, nontender, no hepatomegaly, no bruits Ext: compression hose in place B. Skin: warm and dry GU: deferred Neuro: no focal abnormalities noted, AAO x 3  EKG:  Today 08/20/15-sinus bradycardia with sinus arrhythmia, heart rate 58 bpm, left anterior fascicular block 08/15/14-sinus rhythm, sinus arrhythmia, left anterior fascicular block, left ventricular hypertrophy. 09/08/13-sinus rhythm, 67, PAC, QT interval 468 ms.  Echocardiogram: 12/06/13 - Left ventricle: The cavity size was normal. Wall thickness wasincreased in a pattern of moderate LVH. Systolic function wasnormal. The estimated ejection fraction was in the range of 55%to 60%. Wall motion was normal; there were no regional wallmotion abnormalities. Doppler parameters are consistent withabnormal left ventricular relaxation (grade 1 diastolicdysfunction). Doppler parameters are consistent with highventricular filling pressure. - Aortic valve: A bioprosthesis was present. Mean gradient 58mmHg. - Ascending aorta: The ascending aorta was moderately dilated. - Mitral valve: Calcified annulus. - Left atrium: The atrium was mildly dilated.  Labs: Creatinine 0.54, potassium 3.8, hemoglobin 13.1, INR 3.5     ASSESSMENT AND PLAN:  1. Lower extremity wounds/Weeping lower extremities-amazing progress since last time I saw her. She was seeing Dr. Zigmund Daniel at [facility]. They were very impressed and very thankful for the high quality of care that they received.   Agree with continued Lasix is now being taken on as-needed basis, compression hose, salt limitation, fluid limitation, raising of lower extremities. Continuing  with increased protein.   Vascular surgery note reviewed. 2. Bioprosthetic aortic valve secondary to aortic valve stenosis- we will repeat echocardiogram with her dyspnea sometimes at rest.. Dental antibiotic prophylaxis. Murmur is fairly significant. 3. Paroxysmal atrial fibrillation-currently sinus rhythm, low dose sotalol. We discussed the possibility of every 24 hours dosing however she is doing very well on very low dose split dosing at 40 mg twice a day. We will continue. 4. Dilated aortic root, ascending, 5.2 x 4.6 cm on CT scan. Measures 2.9 cm at the sinotubular junction we will continue to monitor. Would be high risk for surgical procedure. No evidence of dissection.  5. Coronary artery disease-single vessel bypass performed in 2005. Stable. 6. Aortic atherosclerosis-seen on CT scan 7. Chronic anticoagulation - coumadin. Dr. Nyoka Cowden. Has been adjusting especially since being on recent antibiotic. 8. I will see her back in 6 months.    Signed, Candee Furbish, MD  [facility]  08/20/2015 3:09 PM

## 2015-08-21 DIAGNOSIS — Z7901 Long term (current) use of anticoagulants: Secondary | ICD-10-CM | POA: Diagnosis not present

## 2015-08-21 DIAGNOSIS — D802 Selective deficiency of immunoglobulin A [IgA]: Secondary | ICD-10-CM | POA: Diagnosis not present

## 2015-09-02 DIAGNOSIS — D489 Neoplasm of uncertain behavior, unspecified: Secondary | ICD-10-CM | POA: Diagnosis not present

## 2015-09-02 DIAGNOSIS — Z7901 Long term (current) use of anticoagulants: Secondary | ICD-10-CM | POA: Diagnosis not present

## 2015-09-02 DIAGNOSIS — I890 Lymphedema, not elsewhere classified: Secondary | ICD-10-CM | POA: Diagnosis not present

## 2015-09-02 DIAGNOSIS — Z951 Presence of aortocoronary bypass graft: Secondary | ICD-10-CM | POA: Diagnosis not present

## 2015-09-02 DIAGNOSIS — L97922 Non-pressure chronic ulcer of unspecified part of left lower leg with fat layer exposed: Secondary | ICD-10-CM | POA: Diagnosis not present

## 2015-09-02 DIAGNOSIS — I872 Venous insufficiency (chronic) (peripheral): Secondary | ICD-10-CM | POA: Diagnosis not present

## 2015-09-02 DIAGNOSIS — I482 Chronic atrial fibrillation: Secondary | ICD-10-CM | POA: Diagnosis not present

## 2015-09-03 DIAGNOSIS — L97929 Non-pressure chronic ulcer of unspecified part of left lower leg with unspecified severity: Secondary | ICD-10-CM | POA: Diagnosis not present

## 2015-09-09 ENCOUNTER — Other Ambulatory Visit (HOSPITAL_COMMUNITY): Payer: Medicare Other

## 2015-09-15 MED FILL — HYDROCODON-APAP 5-325: 5-325 | 40 days supply | Qty: 120 | Fill #0

## 2015-09-16 DIAGNOSIS — Z7901 Long term (current) use of anticoagulants: Secondary | ICD-10-CM | POA: Diagnosis not present

## 2015-09-19 DIAGNOSIS — Z9889 Other specified postprocedural states: Secondary | ICD-10-CM | POA: Diagnosis not present

## 2015-09-19 DIAGNOSIS — D489 Neoplasm of uncertain behavior, unspecified: Secondary | ICD-10-CM | POA: Diagnosis not present

## 2015-09-19 DIAGNOSIS — L97922 Non-pressure chronic ulcer of unspecified part of left lower leg with fat layer exposed: Secondary | ICD-10-CM | POA: Diagnosis not present

## 2015-09-29 MED FILL — predniSONE 5 MG TABS: 5 | 90 days supply | Qty: 90 | Fill #0

## 2015-09-30 DIAGNOSIS — Z5181 Encounter for therapeutic drug level monitoring: Secondary | ICD-10-CM | POA: Diagnosis not present

## 2015-09-30 DIAGNOSIS — Z955 Presence of coronary angioplasty implant and graft: Secondary | ICD-10-CM | POA: Diagnosis not present

## 2015-09-30 DIAGNOSIS — I779 Disorder of arteries and arterioles, unspecified: Secondary | ICD-10-CM | POA: Diagnosis not present

## 2015-09-30 DIAGNOSIS — I890 Lymphedema, not elsewhere classified: Secondary | ICD-10-CM | POA: Diagnosis not present

## 2015-09-30 DIAGNOSIS — I872 Venous insufficiency (chronic) (peripheral): Secondary | ICD-10-CM | POA: Diagnosis not present

## 2015-09-30 DIAGNOSIS — I4891 Unspecified atrial fibrillation: Secondary | ICD-10-CM | POA: Diagnosis not present

## 2015-09-30 DIAGNOSIS — L97922 Non-pressure chronic ulcer of unspecified part of left lower leg with fat layer exposed: Secondary | ICD-10-CM | POA: Diagnosis not present

## 2015-09-30 DIAGNOSIS — L03116 Cellulitis of left lower limb: Secondary | ICD-10-CM | POA: Diagnosis not present

## 2015-09-30 DIAGNOSIS — Z951 Presence of aortocoronary bypass graft: Secondary | ICD-10-CM | POA: Diagnosis not present

## 2015-09-30 DIAGNOSIS — Z7901 Long term (current) use of anticoagulants: Secondary | ICD-10-CM | POA: Diagnosis not present

## 2015-09-30 MED FILL — CEPHALEXIN 500 MG CAPSULE: 500 | 10 days supply | Qty: 40 | Fill #0

## 2015-10-01 DIAGNOSIS — L97929 Non-pressure chronic ulcer of unspecified part of left lower leg with unspecified severity: Secondary | ICD-10-CM | POA: Diagnosis not present

## 2015-10-09 DIAGNOSIS — Z7901 Long term (current) use of anticoagulants: Secondary | ICD-10-CM | POA: Diagnosis not present

## 2015-10-14 DIAGNOSIS — I872 Venous insufficiency (chronic) (peripheral): Secondary | ICD-10-CM | POA: Diagnosis not present

## 2015-10-14 DIAGNOSIS — C44729 Squamous cell carcinoma of skin of left lower limb, including hip: Secondary | ICD-10-CM | POA: Diagnosis not present

## 2015-10-14 DIAGNOSIS — L97922 Non-pressure chronic ulcer of unspecified part of left lower leg with fat layer exposed: Secondary | ICD-10-CM | POA: Diagnosis not present

## 2015-10-14 DIAGNOSIS — L97822 Non-pressure chronic ulcer of other part of left lower leg with fat layer exposed: Secondary | ICD-10-CM | POA: Diagnosis not present

## 2015-10-14 DIAGNOSIS — I482 Chronic atrial fibrillation: Secondary | ICD-10-CM | POA: Diagnosis not present

## 2015-10-14 DIAGNOSIS — Z7901 Long term (current) use of anticoagulants: Secondary | ICD-10-CM | POA: Diagnosis not present

## 2015-10-14 MED FILL — CEPHALEXIN 500 MG CAPSULE: 500 | 10 days supply | Qty: 30 | Fill #0

## 2015-10-21 DIAGNOSIS — Z1283 Encounter for screening for malignant neoplasm of skin: Secondary | ICD-10-CM | POA: Diagnosis not present

## 2015-10-21 DIAGNOSIS — R55 Syncope and collapse: Secondary | ICD-10-CM | POA: Diagnosis not present

## 2015-10-21 DIAGNOSIS — I4891 Unspecified atrial fibrillation: Secondary | ICD-10-CM | POA: Diagnosis not present

## 2015-10-21 DIAGNOSIS — R3911 Hesitancy of micturition: Secondary | ICD-10-CM | POA: Diagnosis not present

## 2015-10-23 DIAGNOSIS — I4891 Unspecified atrial fibrillation: Secondary | ICD-10-CM | POA: Diagnosis not present

## 2015-10-23 MED FILL — HYDROCODON-APAP 5-325: 5-325 | 40 days supply | Qty: 120 | Fill #0

## 2015-10-24 DIAGNOSIS — Z951 Presence of aortocoronary bypass graft: Secondary | ICD-10-CM | POA: Diagnosis not present

## 2015-10-24 DIAGNOSIS — D489 Neoplasm of uncertain behavior, unspecified: Secondary | ICD-10-CM | POA: Diagnosis not present

## 2015-10-24 DIAGNOSIS — C44729 Squamous cell carcinoma of skin of left lower limb, including hip: Secondary | ICD-10-CM | POA: Diagnosis not present

## 2015-10-24 DIAGNOSIS — L97922 Non-pressure chronic ulcer of unspecified part of left lower leg with fat layer exposed: Secondary | ICD-10-CM | POA: Diagnosis not present

## 2015-10-24 DIAGNOSIS — I872 Venous insufficiency (chronic) (peripheral): Secondary | ICD-10-CM | POA: Diagnosis not present

## 2015-10-27 DIAGNOSIS — H26493 Other secondary cataract, bilateral: Secondary | ICD-10-CM | POA: Diagnosis not present

## 2015-11-03 ENCOUNTER — Encounter (HOSPITAL_COMMUNITY): Payer: Self-pay | Admitting: Emergency Medicine

## 2015-11-03 ENCOUNTER — Ambulatory Visit (INDEPENDENT_AMBULATORY_CARE_PROVIDER_SITE_OTHER): Payer: Medicare Other

## 2015-11-03 ENCOUNTER — Ambulatory Visit (HOSPITAL_COMMUNITY)
Admission: EM | Admit: 2015-11-03 | Discharge: 2015-11-03 | Disposition: A | Discharge status: Home / Self Care | Payer: Medicare Other | Attending: Family Medicine | Admitting: Family Medicine

## 2015-11-03 DIAGNOSIS — G8929 Other chronic pain: Secondary | ICD-10-CM

## 2015-11-03 DIAGNOSIS — M419 Scoliosis, unspecified: Secondary | ICD-10-CM | POA: Diagnosis not present

## 2015-11-03 DIAGNOSIS — M549 Dorsalgia, unspecified: Secondary | ICD-10-CM | POA: Diagnosis not present

## 2015-11-03 DIAGNOSIS — R062 Wheezing: Secondary | ICD-10-CM

## 2015-11-03 NOTE — ED Triage Notes (Signed)
The patient presented to the [facility] with a complaint of chronic back pain that started to flare up 3 days ago.

## 2015-11-03 NOTE — Discharge Instructions (Signed)
REVIEW OF THE XRAY DOES NOT REVEAL ANY NEW COMPRESSION FRACTURE. YOU MAY TAKE AN ADDITIONAL HYDROCODONE EACH DAY WHICH WOULD BE 1 PILL EVERY 6 HOURS INSTEAD OF THE EVERY 8 HOURS THAT YOU ARE DOING NOW. ANOTHER ALTERNATIVE IS TO TAKE 2 TABLETS JUST BEFORE BED.  APPLY HEAT TO YOUR BACK FOLLOW UP WITH DR. Nyoka Cowden.

## 2015-11-03 NOTE — ED Provider Notes (Signed)
CSN: [id number]     Arrival date & time 11/03/15  1236 History   First MD Initiated Contact with Patient 11/03/15 1334     Chief Complaint  Patient presents with  . Back Pain   (Consider location/radiation/quality/duration/timing/severity/associated sxs/prior Treatment) HPI 82 Y/O FEMALE WITH ONSET OF BACK PAIN Saturday. NO KNOWN INJURY USING HYDROCODONE FOR PAIN 5 MG 3 X A DAY. WEARING BACK BRACE AT THIS TIME.  Past Medical History:  Diagnosis Date  . Atrial fibrillation ([place])   . Colon polyps   . Coronary artery disease   . DJD (degenerative joint disease)   . DJD (degenerative joint disease)   . Hyperlipidemia   . Kidney stone   . Osteoporosis   . PUD (peptic ulcer disease)   . Thoracic aortic aneurysm ([place])   . Thyroid disease    Hypothyroidism   Past Surgical History:  Procedure Laterality Date  . AORTIC VALVE REPLACEMENT  03/29/2003  . CARDIAC VALVE REPLACEMENT     aortic valve  . CORONARY ARTERY BYPASS GRAFT  03/29/2003  . FEMORAL ARTERY STENT Left 04/2003  . RENAL ARTERY STENT     Family History  Problem Relation Age of Onset  . Heart disease Mother   . Hypertension Mother   . Dementia Mother   . Heart disease Father   . Hypertension Father     Before age 9  . Heart attack Father     X ' s   2-3  . Heart disease Brother   . Hypertension Brother   . Diabetes Daughter   . Stroke Neg Hx    Social History  Substance Use Topics  . Smoking status: Never Smoker  . Smokeless tobacco: Never Used  . Alcohol use No   OB History    No data available     Review of Systems  Denies: HEADACHE, NAUSEA, ABDOMINAL PAIN, CHEST PAIN, CONGESTION, DYSURIA, SHORTNESS OF BREATH  Allergies  Shrimp [shellfish allergy]  Home Medications   Prior to Admission medications   Medication Sig Start Date End Date Taking? Authorizing Provider  aspirin 81 MG tablet Take 81 mg by mouth every other day.    Historical Provider, MD  Calcium Carbonate-Vitamin D (CALCIUM 600+D)  600-200 MG-UNIT TABS Take 2 tablets by mouth daily. Reported on 03/18/2015    Historical Provider, MD  Docusate Calcium (STOOL SOFTENER PO) Take by mouth as needed.    Historical Provider, MD  Hydrocodone-Acetaminophen 5-300 MG TABS Take 5-300 mg by mouth 4 (four) times daily.  05/31/14   Historical Provider, MD  levothyroxine (SYNTHROID, LEVOTHROID) 88 MCG tablet Take 88 mcg by mouth daily.    Historical Provider, MD  predniSONE (DELTASONE) 5 MG tablet Take 5 mg by mouth daily with breakfast.     Historical Provider, MD  sotalol (BETAPACE) 80 MG tablet Take 40 mg by mouth 2 (two) times daily.    Historical Provider, MD  temazepam (RESTORIL) 15 MG capsule Take 15 mg by mouth at bedtime.    Historical Provider, MD  warfarin (COUMADIN) 2.5 MG tablet Take 2.5 mg by mouth See admin instructions. Take 2.5 by mouth everyday    Historical Provider, MD   Meds Ordered and Administered this Visit  Medications - No data to display  BP 96/66 (BP Location: Left Arm)   Pulse 65   Temp 98.2 F (36.8 C) (Oral)   Resp 16   Ht 4\' 10"  (1.473 m)   Wt 102 lb (46.3 kg)   SpO2 97%  BMI 21.32 kg/m  No data found.   Physical Exam NURSES NOTES AND VITAL SIGNS REVIEWED. CONSTITUTIONAL: Well developed, well nourished, no acute distress HEENT: normocephalic, atraumatic EYES: Conjunctiva normal NECK:normal ROM, supple, no adenopathy PULMONARY:No respiratory distress, normal effort ABDOMINAL: Soft, ND, NT BS+, No CVAT MUSCULOSKELETAL: Normal ROM of all extremities,  SKIN: warm and dry without rash PSYCHIATRIC: Mood and affect, behavior are normal  Urgent Care Course   Clinical Course    Procedures (including critical care time)  Labs Review Labs Reviewed - No data to display  Imaging Review Dg Thoracic Spine 2 View  Result Date: 11/03/2015 CLINICAL DATA:  Pain in the upper back for 2 days. Prior back surgery. EXAM: THORACIC SPINE 2 VIEWS COMPARISON:  Chest radiograph 01/03/2014 and chest CT  10/08/2013 FINDINGS: The bones are osteopenic. There is S shaped thoracolumbar scoliosis with greatest curvature to the left, centered at the L2-3 level. Findings a remote vertebral augmentation at L1. Moderate compression deformity of the T12 vertebral body, which appears unchanged compared to the prior chest radiograph. There is multilevel lumbar and lower thoracic facet arthrosis. No acute fracture is identified. Incompletely visualized lungs are clear. Findings of prior CABG and valve replacement are noted. IMPRESSION: Osteoporosis, scoliosis and advanced lower thoracic and lumbar degenerative change. Unchanged appearance of chronic compression fractures at T12 and L1. No acute fracture identified. Electronically Signed   By: Ulyses Jarred M.D.   On: 11/03/2015 14:33     Visual Acuity Review  Right Eye Distance:   Left Eye Distance:   Bilateral Distance:    Right Eye Near:   Left Eye Near:    Bilateral Near:         MDM   1. Chronic back pain     Patient is reassured that there are no issues that require transfer to higher level of care at this time or additional tests. Patient is advised to continue home symptomatic treatment. Patient is advised that if there are new or worsening symptoms to attend the emergency department, contact primary care provider, or return to UC. Instructions of care provided discharged home in stable condition.    THIS NOTE WAS GENERATED USING A VOICE RECOGNITION SOFTWARE PROGRAM. ALL REASONABLE EFFORTS  WERE MADE TO PROOFREAD THIS DOCUMENT FOR ACCURACY.  I have verbally reviewed the discharge instructions with the patient. A printed AVS was given to the patient.  All questions were answered prior to discharge.      Konrad Felix, PA 11/03/15 1436

## 2015-11-05 ENCOUNTER — Encounter (HOSPITAL_COMMUNITY): Payer: Self-pay | Admitting: Emergency Medicine

## 2015-11-05 ENCOUNTER — Ambulatory Visit (HOSPITAL_COMMUNITY)
Admission: EM | Admit: 2015-11-05 | Discharge: 2015-11-05 | Disposition: A | Discharge status: Home / Self Care | Payer: Medicare Other | Attending: Family Medicine | Admitting: Family Medicine

## 2015-11-05 ENCOUNTER — Ambulatory Visit (INDEPENDENT_AMBULATORY_CARE_PROVIDER_SITE_OTHER): Payer: Medicare Other

## 2015-11-05 DIAGNOSIS — R062 Wheezing: Secondary | ICD-10-CM

## 2015-11-05 MED ORDER — ALBUTEROL SULFATE HFA 108 (90 BASE) MCG/ACT IN AERS: 1.0000 | INHALATION_SPRAY | Freq: Four times a day (QID) | RESPIRATORY_TRACT | 0 refills | Status: DC | PRN

## 2015-11-05 NOTE — ED Triage Notes (Signed)
The patient presented to the [facility] with a complaint of shortness of breath and wheezing that started this am. The patient stated that it is on and off and currently she is not experiencing any shortness of breath or wheezing.

## 2015-11-05 NOTE — ED Provider Notes (Signed)
CSN: [id number]     Arrival date & time 11/05/15  1237 History   First MD Initiated Contact with Patient 11/05/15 1351     Chief Complaint  Patient presents with  . Shortness of Breath   (Consider location/radiation/quality/duration/timing/severity/associated sxs/prior Treatment) [name] is a 82 y.o well-appearing female with history of hyperlipidemia, a-fib, coronary artery disease, presents today for wheezing onset last night but got worst this morning accompany by shortness of breath and a pain going from her abdomen to her chest.   Patient states that wheezes here and there at times, sometimes due to allergies. She have this wheezes since last night but the wheezes comes and goes. She states that she currently does not have wheezing, shortness of breath, or chest pain in the room. She is not able to tell me how long her wheezing last or how many episodes of wheezing she have had since last night or since this morning.  Her daughter states that she could hear the wheezing across from room. Per patient, her son-in-law is a physician and came over to evaluate her this morning and did not hear any wheezing at the time when he evaluated her, and did not feel that she needed to go to the ER.       Past Medical History:  Diagnosis Date  . Atrial fibrillation ([place])   . Colon polyps   . Coronary artery disease   . DJD (degenerative joint disease)   . DJD (degenerative joint disease)   . Hyperlipidemia   . Kidney stone   . Osteoporosis   . PUD (peptic ulcer disease)   . Thoracic aortic aneurysm ([place])   . Thyroid disease    Hypothyroidism   Past Surgical History:  Procedure Laterality Date  . AORTIC VALVE REPLACEMENT  03/29/2003  . CARDIAC VALVE REPLACEMENT     aortic valve  . CORONARY ARTERY BYPASS GRAFT  03/29/2003  . FEMORAL ARTERY STENT Left 04/2003  . RENAL ARTERY STENT     Family History  Problem Relation Age of Onset  . Heart disease Mother   . Hypertension Mother   .  Dementia Mother   . Heart disease Father   . Hypertension Father     Before age 24  . Heart attack Father     X ' s   2-3  . Heart disease Brother   . Hypertension Brother   . Diabetes Daughter   . Stroke Neg Hx    Social History  Substance Use Topics  . Smoking status: Never Smoker  . Smokeless tobacco: Never Used  . Alcohol use No   OB History    No data available     Review of Systems  Constitutional: Negative for chills and fatigue.  HENT: Negative for congestion and rhinorrhea.   Respiratory: Negative for chest tightness.   Cardiovascular: Negative for palpitations and leg swelling.       Positive for pain from abdomen to chest during her wheezing episodes  Gastrointestinal: Negative for diarrhea and nausea.  Skin: Negative.   Neurological: Negative for dizziness and weakness.    Allergies  Shrimp [shellfish allergy]  Home Medications   Prior to Admission medications   Medication Sig Start Date End Date Taking? Authorizing Provider  aspirin 81 MG tablet Take 81 mg by mouth every other day.   Yes Historical Provider, MD  Calcium Carbonate-Vitamin D (CALCIUM 600+D) 600-200 MG-UNIT TABS Take 2 tablets by mouth daily. Reported on 03/18/2015   Yes Historical Provider,  MD  Docusate Calcium (STOOL SOFTENER PO) Take by mouth as needed.   Yes Historical Provider, MD  Hydrocodone-Acetaminophen 5-300 MG TABS Take 5-300 mg by mouth 4 (four) times daily.  05/31/14  Yes Historical Provider, MD  levothyroxine (SYNTHROID, LEVOTHROID) 88 MCG tablet Take 88 mcg by mouth daily.   Yes Historical Provider, MD  predniSONE (DELTASONE) 5 MG tablet Take 5 mg by mouth daily with breakfast.    Yes Historical Provider, MD  sotalol (BETAPACE) 80 MG tablet Take 40 mg by mouth 2 (two) times daily.   Yes Historical Provider, MD  temazepam (RESTORIL) 15 MG capsule Take 15 mg by mouth at bedtime.   Yes Historical Provider, MD  warfarin (COUMADIN) 2.5 MG tablet Take 2.5 mg by mouth See admin  instructions. Take 2.5 by mouth everyday   Yes Historical Provider, MD   Meds Ordered and Administered this Visit  Medications - No data to display  BP 136/83 (BP Location: Right Arm)   Pulse 60   Temp 98.2 F (36.8 C) (Oral)   Resp 20   SpO2 96%  No data found.   Physical Exam  Constitutional: She is oriented to person, place, and time. She appears well-developed and well-nourished. No distress.  HENT:  Head: Normocephalic and atraumatic.  Right Ear: External ear normal.  Left Ear: External ear normal.  Mouth/Throat: No oropharyngeal exudate.  Eyes: EOM are normal. Pupils are equal, round, and reactive to light.  Neck: Normal range of motion. Neck supple.  Cardiovascular: Normal rate, regular rhythm and intact distal pulses.   Murmur heard. Grade III/VI murmur most prominent at pulmonic area   Pulmonary/Chest: Effort normal and breath sounds normal. She has no wheezes.  No accessory muscle use.   Abdominal: Soft. Bowel sounds are normal. She exhibits no distension. There is no tenderness.  Musculoskeletal: Normal range of motion. She exhibits no edema.  Lymphadenopathy:    She has no cervical adenopathy.  Neurological: She is alert and oriented to person, place, and time.  Skin: Skin is warm and dry. Capillary refill takes less than 2 seconds. She is not diaphoretic. No pallor.  Psychiatric: She has a normal mood and affect.    Urgent Care Course   Clinical Course    Procedures (including critical care time)  Labs Review Labs Reviewed - No data to display  Imaging Review     MDM  No diagnosis found.  [name] is a 82 y.o well-appearing female with history of hyperlipidemia, a-fib, coronary artery disease, presents today for intermittent wheezing onset last night but got worst this morning accompanied by shortness of breath and a pain going from her abdomen to her chest during her wheezing episodes. Patient reports that wheezing comes and goes and is not  having any SOB, wheezing or chest pain currently. Her physical examination was completely normal. Chest xray showed Chronic cardiomegaly. Patient discharged home with rx for albuterol inhaler; instructed to use as needed for wheezing or shortness of breath; instructed to f/u with her PCP or cardiologist. Return precaution discussed.     Barry Dienes, NP 11/05/15 1655

## 2015-11-06 ENCOUNTER — Telehealth: Payer: Self-pay | Admitting: Cardiology

## 2015-11-06 NOTE — Telephone Encounter (Signed)
New message     Pt dtr would like pt to see the doc today. Please advise    Pt c/o Shortness Of Breath: STAT if SOB developed within the last 24 hours or pt is noticeably SOB on the phone  1. Are you currently SOB (can you hear that pt is SOB on the phone)? yes 2. How long have you been experiencing SOB? Since yesterday  3. Are you SOB when sitting or when up moving around? Moving around  4. Are you currently experiencing any other symptoms? Back pain and wheezing

## 2015-11-06 NOTE — Telephone Encounter (Signed)
Daughter, Thad Ranger, calling stating her Mom has been in ER the past 2 days.  On 8/14 she went in for back pain.  Did back x-ray and told was normal.  Yesterday went to ER for wheezing.  Chest Xray was done and did not show any edema.  [name] states this morning she woke up with the back pain and wheezing again.  States she is SOB when talking.  Denies CP.  Some swelling in ankles but no more than usual.  States she has not had any wt gain in fact she is losing wt.  No hx of COPD, asthma or any lung problems.  Their PCP, Dr. Nyoka Cowden is out of town.  Spoke w/Dr. Marlou Porch who does not feel this is cardiac related and for her to call the MD on call for Dr. Nyoka Cowden.  Advised the daughter of his recommendations.  She states they prefer not to see the physician on call.  Will wait and she if she starts to get worse and then take her back to ER.  She is taking Prednisone 5 mg daily and albuterol.  She verbalizes understanding of Dr. Marlou Porch recommendations.

## 2015-11-10 DIAGNOSIS — R791 Abnormal coagulation profile: Secondary | ICD-10-CM | POA: Diagnosis not present

## 2015-11-10 DIAGNOSIS — R8290 Unspecified abnormal findings in urine: Secondary | ICD-10-CM | POA: Diagnosis not present

## 2015-11-10 DIAGNOSIS — R8279 Other abnormal findings on microbiological examination of urine: Secondary | ICD-10-CM | POA: Diagnosis not present

## 2015-11-10 DIAGNOSIS — R319 Hematuria, unspecified: Secondary | ICD-10-CM | POA: Diagnosis not present

## 2015-11-10 DIAGNOSIS — M546 Pain in thoracic spine: Secondary | ICD-10-CM | POA: Diagnosis not present

## 2015-11-10 MED FILL — WARFARIN SODIUM 2.5 MG TAB: 2.5 | 84 days supply | Qty: 108 | Fill #2

## 2015-11-11 DIAGNOSIS — L97929 Non-pressure chronic ulcer of unspecified part of left lower leg with unspecified severity: Secondary | ICD-10-CM | POA: Diagnosis not present

## 2015-11-20 DIAGNOSIS — N390 Urinary tract infection, site not specified: Secondary | ICD-10-CM | POA: Diagnosis not present

## 2015-11-20 DIAGNOSIS — Z7901 Long term (current) use of anticoagulants: Secondary | ICD-10-CM | POA: Diagnosis not present

## 2015-11-20 DIAGNOSIS — R8279 Other abnormal findings on microbiological examination of urine: Secondary | ICD-10-CM | POA: Diagnosis not present

## 2015-11-20 DIAGNOSIS — I4891 Unspecified atrial fibrillation: Secondary | ICD-10-CM | POA: Diagnosis not present

## 2015-12-01 DIAGNOSIS — C44729 Squamous cell carcinoma of skin of left lower limb, including hip: Secondary | ICD-10-CM | POA: Diagnosis not present

## 2015-12-01 DIAGNOSIS — I872 Venous insufficiency (chronic) (peripheral): Secondary | ICD-10-CM | POA: Diagnosis not present

## 2015-12-01 DIAGNOSIS — L97922 Non-pressure chronic ulcer of unspecified part of left lower leg with fat layer exposed: Secondary | ICD-10-CM | POA: Diagnosis not present

## 2015-12-01 DIAGNOSIS — I482 Chronic atrial fibrillation: Secondary | ICD-10-CM | POA: Diagnosis not present

## 2015-12-01 DIAGNOSIS — Z7901 Long term (current) use of anticoagulants: Secondary | ICD-10-CM | POA: Diagnosis not present

## 2015-12-01 DIAGNOSIS — R600 Localized edema: Secondary | ICD-10-CM | POA: Diagnosis not present

## 2015-12-01 DIAGNOSIS — Z951 Presence of aortocoronary bypass graft: Secondary | ICD-10-CM | POA: Diagnosis not present

## 2015-12-01 DIAGNOSIS — I890 Lymphedema, not elsewhere classified: Secondary | ICD-10-CM | POA: Diagnosis not present

## 2015-12-02 ENCOUNTER — Ambulatory Visit (HOSPITAL_COMMUNITY): Payer: Medicare Other | Attending: Cardiology

## 2015-12-02 ENCOUNTER — Other Ambulatory Visit: Payer: Self-pay

## 2015-12-02 DIAGNOSIS — I517 Cardiomegaly: Secondary | ICD-10-CM | POA: Insufficient documentation

## 2015-12-02 DIAGNOSIS — I739 Peripheral vascular disease, unspecified: Secondary | ICD-10-CM | POA: Diagnosis not present

## 2015-12-02 DIAGNOSIS — I77810 Thoracic aortic ectasia: Secondary | ICD-10-CM | POA: Insufficient documentation

## 2015-12-02 DIAGNOSIS — Z953 Presence of xenogenic heart valve: Secondary | ICD-10-CM

## 2015-12-02 DIAGNOSIS — R0600 Dyspnea, unspecified: Secondary | ICD-10-CM | POA: Insufficient documentation

## 2015-12-02 DIAGNOSIS — Z954 Presence of other heart-valve replacement: Secondary | ICD-10-CM | POA: Diagnosis not present

## 2015-12-02 DIAGNOSIS — I359 Nonrheumatic aortic valve disorder, unspecified: Secondary | ICD-10-CM | POA: Diagnosis present

## 2015-12-02 DIAGNOSIS — I071 Rheumatic tricuspid insufficiency: Secondary | ICD-10-CM | POA: Insufficient documentation

## 2015-12-02 DIAGNOSIS — I350 Nonrheumatic aortic (valve) stenosis: Secondary | ICD-10-CM | POA: Insufficient documentation

## 2015-12-02 DIAGNOSIS — I059 Rheumatic mitral valve disease, unspecified: Secondary | ICD-10-CM | POA: Diagnosis not present

## 2015-12-02 MED FILL — HYDROCODON-APAP 5-325: 5-325 | 40 days supply | Qty: 120 | Fill #0

## 2015-12-02 NOTE — Progress Notes (Unsigned)
Talked to Dr. Marlou Porch about echo, the nurse is to schedule f/u with him. Nettie Elm RCS

## 2015-12-03 ENCOUNTER — Telehealth: Payer: Self-pay | Admitting: Cardiology

## 2015-12-03 DIAGNOSIS — I4891 Unspecified atrial fibrillation: Secondary | ICD-10-CM | POA: Diagnosis not present

## 2015-12-03 NOTE — Telephone Encounter (Signed)
Follow Up:     Pt would like her echo results from yesterday please. Please call her asap,she needs to go get her lab work.

## 2015-12-03 NOTE — Telephone Encounter (Signed)
Follow up    Pt daughter verbalized that she is calling back for the results of the Echo that was done on 12-02-15

## 2015-12-03 NOTE — Telephone Encounter (Signed)
Advised daughter that had discussed with Ms. Stjohn that Dr. Marlou Porch would have to call her tomorrow with results of Echo.  She did give the phone number for pre-op at [facility] [phone] and Dr. Geophysicist/field seismologist) [phone]. States  anesth needed to know results.

## 2015-12-03 NOTE — Telephone Encounter (Signed)
Please have her come in to clinic to discuss. Candee Furbish, MD

## 2015-12-04 NOTE — Telephone Encounter (Signed)
Dr Marlou Porch spoke with MD at [facility] who is aware pt is not opting to have surgery at this time.

## 2015-12-04 NOTE — Telephone Encounter (Signed)
Dr Marlou Porch spoke with daughter about echo results, pt's condition and recommendations r/t surgery.

## 2015-12-04 NOTE — Telephone Encounter (Signed)
Dr Marlou Porch spoke with pt in detail about echo results and recommendations.

## 2015-12-04 NOTE — Telephone Encounter (Signed)
Spoke with daughter and with Ms. [name] regarding echocardiogram results which demonstrate severe aortic stenosis of bioprosthetic aortic valve. Normal ejection fraction. Based upon these findings, she would be high risk for surgery. Both she and her daughter understand and do not wish to proceed with surgery/general anesthesia/local anesthesia.  Spoke to Dr. Vernona Rieger of plastic surgery, [place] will be in touch with dermatology as well for possible alternatives.  Candee Furbish, MD

## 2015-12-12 DIAGNOSIS — I779 Disorder of arteries and arterioles, unspecified: Secondary | ICD-10-CM | POA: Diagnosis not present

## 2015-12-12 DIAGNOSIS — I872 Venous insufficiency (chronic) (peripheral): Secondary | ICD-10-CM | POA: Diagnosis not present

## 2015-12-12 DIAGNOSIS — Z7901 Long term (current) use of anticoagulants: Secondary | ICD-10-CM | POA: Diagnosis not present

## 2015-12-12 DIAGNOSIS — L97922 Non-pressure chronic ulcer of unspecified part of left lower leg with fat layer exposed: Secondary | ICD-10-CM | POA: Diagnosis not present

## 2015-12-12 DIAGNOSIS — M1990 Unspecified osteoarthritis, unspecified site: Secondary | ICD-10-CM | POA: Diagnosis not present

## 2015-12-12 DIAGNOSIS — Z951 Presence of aortocoronary bypass graft: Secondary | ICD-10-CM | POA: Diagnosis not present

## 2015-12-12 DIAGNOSIS — L97921 Non-pressure chronic ulcer of unspecified part of left lower leg limited to breakdown of skin: Secondary | ICD-10-CM | POA: Diagnosis not present

## 2015-12-12 DIAGNOSIS — I482 Chronic atrial fibrillation: Secondary | ICD-10-CM | POA: Diagnosis not present

## 2015-12-16 DIAGNOSIS — L97929 Non-pressure chronic ulcer of unspecified part of left lower leg with unspecified severity: Secondary | ICD-10-CM | POA: Diagnosis not present

## 2015-12-18 DIAGNOSIS — I739 Peripheral vascular disease, unspecified: Secondary | ICD-10-CM | POA: Diagnosis not present

## 2015-12-18 DIAGNOSIS — Z0000 Encounter for general adult medical examination without abnormal findings: Secondary | ICD-10-CM | POA: Diagnosis not present

## 2015-12-18 DIAGNOSIS — I2510 Atherosclerotic heart disease of native coronary artery without angina pectoris: Secondary | ICD-10-CM | POA: Diagnosis not present

## 2015-12-18 DIAGNOSIS — R319 Hematuria, unspecified: Secondary | ICD-10-CM | POA: Diagnosis not present

## 2015-12-18 DIAGNOSIS — C4490 Unspecified malignant neoplasm of skin, unspecified: Secondary | ICD-10-CM | POA: Diagnosis not present

## 2015-12-18 DIAGNOSIS — I350 Nonrheumatic aortic (valve) stenosis: Secondary | ICD-10-CM | POA: Diagnosis not present

## 2015-12-18 DIAGNOSIS — I4891 Unspecified atrial fibrillation: Secondary | ICD-10-CM | POA: Diagnosis not present

## 2015-12-18 DIAGNOSIS — I359 Nonrheumatic aortic valve disorder, unspecified: Secondary | ICD-10-CM | POA: Diagnosis not present

## 2015-12-18 DIAGNOSIS — Z23 Encounter for immunization: Secondary | ICD-10-CM | POA: Diagnosis not present

## 2015-12-25 DIAGNOSIS — Z23 Encounter for immunization: Secondary | ICD-10-CM | POA: Diagnosis not present

## 2015-12-29 MED FILL — predniSONE 5 MG TABS: 5 | 90 days supply | Qty: 90 | Fill #1

## 2015-12-30 DIAGNOSIS — I482 Chronic atrial fibrillation: Secondary | ICD-10-CM | POA: Diagnosis not present

## 2015-12-30 DIAGNOSIS — I890 Lymphedema, not elsewhere classified: Secondary | ICD-10-CM | POA: Diagnosis not present

## 2015-12-30 DIAGNOSIS — I872 Venous insufficiency (chronic) (peripheral): Secondary | ICD-10-CM | POA: Diagnosis not present

## 2015-12-30 DIAGNOSIS — Z7901 Long term (current) use of anticoagulants: Secondary | ICD-10-CM | POA: Diagnosis not present

## 2015-12-30 DIAGNOSIS — L97922 Non-pressure chronic ulcer of unspecified part of left lower leg with fat layer exposed: Secondary | ICD-10-CM | POA: Diagnosis not present

## 2015-12-30 DIAGNOSIS — Z951 Presence of aortocoronary bypass graft: Secondary | ICD-10-CM | POA: Diagnosis not present

## 2015-12-30 DIAGNOSIS — L97921 Non-pressure chronic ulcer of unspecified part of left lower leg limited to breakdown of skin: Secondary | ICD-10-CM | POA: Diagnosis not present

## 2015-12-30 DIAGNOSIS — I779 Disorder of arteries and arterioles, unspecified: Secondary | ICD-10-CM | POA: Diagnosis not present

## 2016-01-13 DIAGNOSIS — L872 Elastosis perforans serpiginosa: Secondary | ICD-10-CM | POA: Diagnosis not present

## 2016-01-13 DIAGNOSIS — Z951 Presence of aortocoronary bypass graft: Secondary | ICD-10-CM | POA: Diagnosis not present

## 2016-01-13 DIAGNOSIS — I872 Venous insufficiency (chronic) (peripheral): Secondary | ICD-10-CM | POA: Diagnosis not present

## 2016-01-13 DIAGNOSIS — I779 Disorder of arteries and arterioles, unspecified: Secondary | ICD-10-CM | POA: Diagnosis not present

## 2016-01-13 DIAGNOSIS — I744 Embolism and thrombosis of arteries of extremities, unspecified: Secondary | ICD-10-CM | POA: Diagnosis not present

## 2016-01-13 DIAGNOSIS — I4891 Unspecified atrial fibrillation: Secondary | ICD-10-CM | POA: Diagnosis not present

## 2016-01-13 DIAGNOSIS — I890 Lymphedema, not elsewhere classified: Secondary | ICD-10-CM | POA: Diagnosis not present

## 2016-01-13 DIAGNOSIS — Z7901 Long term (current) use of anticoagulants: Secondary | ICD-10-CM | POA: Diagnosis not present

## 2016-01-13 DIAGNOSIS — L97922 Non-pressure chronic ulcer of unspecified part of left lower leg with fat layer exposed: Secondary | ICD-10-CM | POA: Diagnosis not present

## 2016-01-14 DIAGNOSIS — L97929 Non-pressure chronic ulcer of unspecified part of left lower leg with unspecified severity: Secondary | ICD-10-CM | POA: Diagnosis not present

## 2016-01-14 MED FILL — HYDROCODON-APAP 5-325: 5-325 | 40 days supply | Qty: 120 | Fill #0

## 2016-01-21 DIAGNOSIS — I4891 Unspecified atrial fibrillation: Secondary | ICD-10-CM | POA: Diagnosis not present

## 2016-01-27 DIAGNOSIS — I482 Chronic atrial fibrillation: Secondary | ICD-10-CM | POA: Diagnosis not present

## 2016-01-27 DIAGNOSIS — Z7901 Long term (current) use of anticoagulants: Secondary | ICD-10-CM | POA: Diagnosis not present

## 2016-01-27 DIAGNOSIS — I779 Disorder of arteries and arterioles, unspecified: Secondary | ICD-10-CM | POA: Diagnosis not present

## 2016-01-27 DIAGNOSIS — L97921 Non-pressure chronic ulcer of unspecified part of left lower leg limited to breakdown of skin: Secondary | ICD-10-CM | POA: Diagnosis not present

## 2016-01-27 DIAGNOSIS — I739 Peripheral vascular disease, unspecified: Secondary | ICD-10-CM | POA: Diagnosis not present

## 2016-01-27 DIAGNOSIS — I872 Venous insufficiency (chronic) (peripheral): Secondary | ICD-10-CM | POA: Diagnosis not present

## 2016-01-27 DIAGNOSIS — Z951 Presence of aortocoronary bypass graft: Secondary | ICD-10-CM | POA: Diagnosis not present

## 2016-01-27 DIAGNOSIS — L97922 Non-pressure chronic ulcer of unspecified part of left lower leg with fat layer exposed: Secondary | ICD-10-CM | POA: Diagnosis not present

## 2016-02-04 MED FILL — WARFARIN SODIUM 2.5 MG TAB: 2.5 | 84 days supply | Qty: 108 | Fill #3

## 2016-02-17 ENCOUNTER — Ambulatory Visit: Payer: Medicare Other | Admitting: Nurse Practitioner

## 2016-02-17 ENCOUNTER — Ambulatory Visit: Payer: Medicare Other | Admitting: Cardiology

## 2016-02-18 DIAGNOSIS — R600 Localized edema: Secondary | ICD-10-CM | POA: Diagnosis not present

## 2016-02-18 DIAGNOSIS — I779 Disorder of arteries and arterioles, unspecified: Secondary | ICD-10-CM | POA: Diagnosis not present

## 2016-02-18 DIAGNOSIS — Z951 Presence of aortocoronary bypass graft: Secondary | ICD-10-CM | POA: Diagnosis not present

## 2016-02-18 DIAGNOSIS — I482 Chronic atrial fibrillation: Secondary | ICD-10-CM | POA: Diagnosis not present

## 2016-02-18 DIAGNOSIS — I872 Venous insufficiency (chronic) (peripheral): Secondary | ICD-10-CM | POA: Diagnosis not present

## 2016-02-18 DIAGNOSIS — Z7901 Long term (current) use of anticoagulants: Secondary | ICD-10-CM | POA: Diagnosis not present

## 2016-02-18 DIAGNOSIS — Z95820 Peripheral vascular angioplasty status with implants and grafts: Secondary | ICD-10-CM | POA: Diagnosis not present

## 2016-02-18 DIAGNOSIS — L97922 Non-pressure chronic ulcer of unspecified part of left lower leg with fat layer exposed: Secondary | ICD-10-CM | POA: Diagnosis not present

## 2016-02-18 DIAGNOSIS — L97921 Non-pressure chronic ulcer of unspecified part of left lower leg limited to breakdown of skin: Secondary | ICD-10-CM | POA: Diagnosis not present

## 2016-02-19 ENCOUNTER — Encounter: Payer: Self-pay | Admitting: Cardiology

## 2016-02-19 ENCOUNTER — Ambulatory Visit (INDEPENDENT_AMBULATORY_CARE_PROVIDER_SITE_OTHER): Payer: Medicare Other | Admitting: Cardiology

## 2016-02-19 VITALS — BP 110/68 | HR 66 | Ht <= 58 in | Wt 104.0 lb

## 2016-02-19 DIAGNOSIS — I77810 Thoracic aortic ectasia: Secondary | ICD-10-CM

## 2016-02-19 DIAGNOSIS — Z953 Presence of xenogenic heart valve: Secondary | ICD-10-CM

## 2016-02-19 DIAGNOSIS — I480 Paroxysmal atrial fibrillation: Secondary | ICD-10-CM

## 2016-02-19 DIAGNOSIS — I2510 Atherosclerotic heart disease of native coronary artery without angina pectoris: Secondary | ICD-10-CM | POA: Diagnosis not present

## 2016-02-19 NOTE — Progress Notes (Signed)
[facility]. [address], [address], [place]  [address] Phone: [phone] Fax:  [phone]  Date:  02/19/2016   ID:  [name], DOB 20-May-1922, MRN [id number]  PCP:  Criselda Peaches, MD   History of Present Illness: [name] is a 82 y.o. female here for CAD, edema follow up.  In July of 2005 she underwent aortic valve replacement with single vessel bypass in [place]. Bioprosthetic aortic valve. She also has peripheral vascular disease with left SFA stent placed in [place] in 2005. She is currently following with Dr. Scot Dock. Kellie Simmering in future according to daughter.   Kellie Simmering referred to [facility]. Dr. Zigmund Daniel wound center. Used pressure boots daily, legs wrapped. They're very pleased with her progress.  Several years ago she was diagnosed with atrial fibrillation. She is currently on chronic anticoagulation followed by Dr. Nyoka Cowden. She has been maintained on sotalol currently very low-dose 40 mg twice a day. QT interval normal.  She has noted some mild increased shortness of breath, trouble getting air out. No chest pain.  Last EKG demonstrated sinus rhythm.  Daughter is friends with Dr. Ron Parker, she is wife of local retired Paediatric nurse.   [place]. Also has compression fractures of vertebral bodies. Chronic back pain.   Wt Readings from Last 3 Encounters:  02/19/16 104 lb (47.2 kg)  11/03/15 102 lb (46.3 kg)  08/20/15 104 lb 1.9 oz (47.2 kg)     Past Medical History:  Diagnosis Date  . Atrial fibrillation ([place])   . Colon polyps   . Coronary artery disease   . DJD (degenerative joint disease)   . DJD (degenerative joint disease)   . Hyperlipidemia   . Kidney stone   . Osteoporosis   . PUD (peptic ulcer disease)   . Thoracic aortic aneurysm ([place])   . Thyroid disease    Hypothyroidism    Past Surgical History:  Procedure Laterality Date  . AORTIC VALVE REPLACEMENT  03/29/2003  . CARDIAC VALVE REPLACEMENT     aortic valve  . CORONARY ARTERY BYPASS  GRAFT  03/29/2003  . FEMORAL ARTERY STENT Left 04/2003  . RENAL ARTERY STENT      Current Outpatient Prescriptions  Medication Sig Dispense Refill  . aspirin 81 MG tablet Take 81 mg by mouth every other day.    Mariane Baumgarten Calcium (STOOL SOFTENER PO) Take by mouth as needed.    . Hydrocodone-Acetaminophen 5-300 MG TABS Take 5-300 mg by mouth 3 (three) times daily.     Marland Kitchen levothyroxine (SYNTHROID, LEVOTHROID) 88 MCG tablet Take 88 mcg by mouth daily.    . predniSONE (DELTASONE) 5 MG tablet Take 5 mg by mouth daily with breakfast.     . sotalol (BETAPACE) 80 MG tablet Take 40 mg by mouth 2 (two) times daily.    . temazepam (RESTORIL) 15 MG capsule Take 15 mg by mouth at bedtime.    Marland Kitchen warfarin (COUMADIN) 2.5 MG tablet Take 2.5 mg by mouth See admin instructions. Take 2.5 by mouth everyday     No current facility-administered medications for this visit.     Allergies:    Allergies  Allergen Reactions  . Ciprofloxacin Diarrhea  . Shrimp [Shellfish Allergy] Hives  . Sulfa Antibiotics Rash    AND HIVES    Social History:  The patient  reports that she has never smoked. She has never used smokeless tobacco. She reports that she does not drink alcohol or use drugs.   Family History  Problem Relation Age of Onset  . Heart disease Mother   . Hypertension Mother   . Dementia Mother   . Heart disease Father   . Hypertension Father     Before age 36  . Heart attack Father     X ' s   2-3  . Heart disease Brother   . Hypertension Brother   . Diabetes Daughter   . Stroke Neg Hx     ROS:  Please see the history of present illness.   Denies any syncope, bleeding, orthopnea, PND. Positive for weeping lower extremities, improving. Occasional excoriations on skin.  All other systems reviewed and negative.   PHYSICAL EXAM: VS:  BP 110/68   Pulse 66   Ht 4\' 10"  (1.473 m)   Wt 104 lb (47.2 kg)   SpO2 95%   BMI 21.74 kg/m  Well nourished, well developed, in no acute distress HEENT:  normal, [place]/AT, EOMI Neck: no JVD, normal carotid upstroke, no bruit Cardiac:  normal S1, S2; RRR; 2/6 systolic right upper sternal border murmurPrior chest wall scar Lungs:  clear to auscultation bilaterally, no wheezing, rhonchi or rales Abd: soft, nontender, no hepatomegaly, no bruits Ext: compression hose in place B. Skin: warm and dry GU: deferred Neuro: no focal abnormalities noted, AAO x 3  EKG:  Today 08/20/15-sinus bradycardia with sinus arrhythmia, heart rate 58 bpm, left anterior fascicular block 08/15/14-sinus rhythm, sinus arrhythmia, left anterior fascicular block, left ventricular hypertrophy. 09/08/13-sinus rhythm, 67, PAC, QT interval 468 ms.  Echocardiogram: 12/06/13 - Left ventricle: The cavity size was normal. Wall thickness wasincreased in a pattern of moderate LVH. Systolic function wasnormal. The estimated ejection fraction was in the range of 55%to 60%. Wall motion was normal; there were no regional wallmotion abnormalities. Doppler parameters are consistent withabnormal left ventricular relaxation (grade 1 diastolicdysfunction). Doppler parameters are consistent with highventricular filling pressure. - Aortic valve: A bioprosthesis was present. Mean gradient 72mmHg. - Ascending aorta: The ascending aorta was moderately dilated. - Mitral valve: Calcified annulus. - Left atrium: The atrium was mildly dilated.  Labs: Creatinine 0.54, potassium 3.8, hemoglobin 13.1, INR 3.5     ASSESSMENT AND PLAN:  1. Lower extremity [facility] 2. Bioprosthetic aortic valve secondary to aortic valve stenosis- severe bioprosthetic valve stenosis Dental antibiotic prophylaxis. Murmur is fairly significant. 3. Paroxysmal atrial fibrillation-currently sinus rhythm, low dose sotalol. We discussed the possibility of every 24 hours dosing however she is doing very well on very low dose split dosing at 40 mg twice a day. We will continue. 4. Dilated aortic root, ascending, 5.2  x 4.6 cm on CT scan. Measures 2.9 cm at the sinotubular junction we will continue to monitor. Would be high risk for surgical procedure. No evidence of dissection. We discussed clinic again. 5. Coronary artery disease-single vessel bypass performed in 2005. Stable. 6. Aortic atherosclerosis-seen on CT scan 7. Chronic anticoagulation - coumadin. Dr. Nyoka Cowden. Has been adjusting especially since being on recent antibiotic. 8. I will see her back in 6 months.    Signed, Candee Furbish, MD [facility]  02/19/2016 3:40 PM

## 2016-02-19 NOTE — Patient Instructions (Signed)

## 2016-02-23 MED FILL — HYDROCODON-APAP 5-325: 5-325 | 40 days supply | Qty: 120 | Fill #0

## 2016-02-25 DIAGNOSIS — I4891 Unspecified atrial fibrillation: Secondary | ICD-10-CM | POA: Diagnosis not present

## 2016-03-09 DIAGNOSIS — L97929 Non-pressure chronic ulcer of unspecified part of left lower leg with unspecified severity: Secondary | ICD-10-CM | POA: Diagnosis not present

## 2016-03-26 MED FILL — predniSONE 5 MG TABS: 5 | 90 days supply | Qty: 90 | Fill #2

## 2016-03-31 MED FILL — LIDOCAINE HCL 2% JELLY: 2 | 7 days supply | Qty: 50 | Fill #0

## 2016-04-01 MED FILL — HYDROCODON-APAP 5-325: 5-325 | 40 days supply | Qty: 120 | Fill #0

## 2016-04-21 MED FILL — AMOXICILLIN 500 MG CAPSULE: 500 | 1 days supply | Qty: 4 | Fill #0

## 2016-04-28 MED FILL — WARFARIN SODIUM 2.5 MG TAB: 2.5 | 84 days supply | Qty: 108 | Fill #4

## 2016-04-29 MED FILL — AMOXICILLIN 500 MG CAPSULE: 500 | 1 days supply | Qty: 4 | Fill #0

## 2016-04-29 MED FILL — TEMAZEPAM 15 MG CAPSULE: 15 | 40 days supply | Qty: 40 | Fill #0

## 2016-05-13 MED FILL — HYDROCODON-APAP 5-325: 5-325 | 40 days supply | Qty: 120 | Fill #0

## 2016-06-08 MED FILL — TEMAZEPAM 15 MG CAPSULE: 15 | 40 days supply | Qty: 40 | Fill #1

## 2016-06-09 MED FILL — LIDOCAINE HCL 2% JELLY: 2 | 10 days supply | Qty: 30 | Fill #1

## 2016-06-18 MED FILL — HYDROCODON-APAP 5-325: 5-325 | 40 days supply | Qty: 120 | Fill #0

## 2016-06-23 MED FILL — predniSONE 5 MG TABS: 5 | 90 days supply | Qty: 90 | Fill #3

## 2016-07-19 MED FILL — LIDOCAINE HCL 2% JELLY: 2 | 10 days supply | Qty: 30 | Fill #2

## 2016-07-20 MED FILL — TEMAZEPAM 15 MG CAPSULE: 15 | 40 days supply | Qty: 40 | Fill #2

## 2016-07-26 MED FILL — WARFARIN SODIUM 2.5 MG TAB: 2.5 | 90 days supply | Qty: 120 | Fill #0

## 2016-07-26 MED FILL — HYDROCODON-APAP 5-325: 5-325 | 40 days supply | Qty: 120 | Fill #0

## 2016-08-03 ENCOUNTER — Ambulatory Visit (INDEPENDENT_AMBULATORY_CARE_PROVIDER_SITE_OTHER): Payer: Medicare Other | Admitting: Cardiology

## 2016-08-03 ENCOUNTER — Encounter (INDEPENDENT_AMBULATORY_CARE_PROVIDER_SITE_OTHER): Payer: Self-pay

## 2016-08-03 VITALS — BP 122/72 | HR 65 | Ht <= 58 in | Wt 104.4 lb

## 2016-08-03 DIAGNOSIS — I480 Paroxysmal atrial fibrillation: Secondary | ICD-10-CM

## 2016-08-03 DIAGNOSIS — I2510 Atherosclerotic heart disease of native coronary artery without angina pectoris: Secondary | ICD-10-CM | POA: Diagnosis not present

## 2016-08-03 DIAGNOSIS — I700 Atherosclerosis of aorta: Secondary | ICD-10-CM | POA: Diagnosis not present

## 2016-08-03 DIAGNOSIS — I77810 Thoracic aortic ectasia: Secondary | ICD-10-CM | POA: Diagnosis not present

## 2016-08-03 DIAGNOSIS — Z953 Presence of xenogenic heart valve: Secondary | ICD-10-CM

## 2016-08-03 NOTE — Progress Notes (Signed)
[place]. [address], [address] [place], [place]  [address] Phone: [phone] Fax:  [phone]  Date:  08/03/2016   ID:  [name], DOB [birth year], MRN [id number]  PCP:  Levin Erp, MD   History of Present Illness: [name] is a 83 y.o. female here for CAD, edema follow up.  In July of 2005 she underwent aortic valve replacement with single vessel bypass in [place]. Bioprosthetic aortic valve. She also has peripheral vascular disease with left SFA stent placed in [place] in 2005. She is currently following with Dr. Scot Dock. Kellie Simmering in future according to daughter.   Kellie Simmering referred to [facility]. Dr. Zigmund Daniel wound center. Used pressure boots daily, legs wrapped. They're very pleased with her progress.  Several years ago she was diagnosed with atrial fibrillation. She is currently on chronic anticoagulation followed by Dr. Nyoka Cowden. She has been maintained on sotalol currently very low-dose 40 mg twice a day. QT interval normal.  She has noted some mild increased shortness of breath, trouble getting air out. No chest pain.  Last EKG demonstrated sinus rhythm.  Daughter is friends with Dr. Ron Parker, she is wife of local retired Paediatric nurse.   [place]. Also has compression fractures of vertebral bodies. Chronic back pain.  08/03/16-overall doing well. New right leg wound. Getting attention for this. She was getting out of wheelchair at her granddaughter's wedding and cut her leg. She is not having any chest pain, no change in shortness of breath. No syncope.   Wt Readings from Last 3 Encounters:  08/03/16 104 lb 6 oz (47.3 kg)  02/19/16 104 lb (47.2 kg)  11/03/15 102 lb (46.3 kg)     Past Medical History:  Diagnosis Date  . Atrial fibrillation ([place])   . Colon polyps   . Coronary artery disease   . DJD (degenerative joint disease)   . DJD (degenerative joint disease)   . Hyperlipidemia   . Kidney stone   . Osteoporosis   . PUD (peptic ulcer disease)   . Thoracic  aortic aneurysm ([place])   . Thyroid disease    Hypothyroidism    Past Surgical History:  Procedure Laterality Date  . AORTIC VALVE REPLACEMENT  03/29/2003  . CARDIAC VALVE REPLACEMENT     aortic valve  . CORONARY ARTERY BYPASS GRAFT  03/29/2003  . FEMORAL ARTERY STENT Left 04/2003  . RENAL ARTERY STENT      Current Outpatient Prescriptions  Medication Sig Dispense Refill  . aspirin 81 MG tablet Take 81 mg by mouth every other day.    Mariane Baumgarten Calcium (STOOL SOFTENER PO) Take by mouth as needed.    . Hydrocodone-Acetaminophen 5-300 MG TABS Take 5-300 mg by mouth 3 (three) times daily.     Marland Kitchen levothyroxine (SYNTHROID, LEVOTHROID) 88 MCG tablet Take 88 mcg by mouth daily.    . predniSONE (DELTASONE) 5 MG tablet Take 5 mg by mouth daily with breakfast.     . sotalol (BETAPACE) 80 MG tablet Take 40 mg by mouth 2 (two) times daily.    . temazepam (RESTORIL) 15 MG capsule Take 15 mg by mouth at bedtime.    Marland Kitchen warfarin (COUMADIN) 2.5 MG tablet Take 2.5 mg by mouth See admin instructions. Take 2.5 by mouth everyday     No current facility-administered medications for this visit.     Allergies:    Allergies  Allergen Reactions  . Ciprofloxacin Diarrhea  . Shrimp [Shellfish Allergy] Hives  . Sulfa Antibiotics Rash  AND HIVES    Social History:  The patient  reports that she has never smoked. She has never used smokeless tobacco. She reports that she does not drink alcohol or use drugs.   Family History  Problem Relation Age of Onset  . Heart disease Mother   . Hypertension Mother   . Dementia Mother   . Heart disease Father   . Hypertension Father        Before age 80  . Heart attack Father        X ' s   2-3  . Heart disease Brother   . Hypertension Brother   . Diabetes Daughter   . Stroke Neg Hx     ROS:  Please see the history of present illness.   Denies any syncope, bleeding, orthopnea, PND. Positive for weeping lower extremities, improving. Occasional excoriations  on skin.  All other systems reviewed and negative.   PHYSICAL EXAM: VS:  BP 122/72   Pulse 65   Ht 4\' 8"  (1.422 m)   Wt 104 lb 6 oz (47.3 kg)   SpO2 95%   BMI 23.40 kg/m  GEN: Well nourished, well developed, in no acute distress. Uses walker HEENT: normal  Neck: no JVD, carotid bruits, or masses Cardiac: RRR; 2/6 S murmur, no rubs, or gallops, no edema  Respiratory:  clear to auscultation bilaterally, normal work of breathing GI: soft, nontender, nondistended, + BS MS: no deformity or atrophy  Skin: warm and dry, no rash, BLE wound care.  Neuro:  Alert and Oriented x 3, Strength and sensation are intact Psych: euthymic mood, full affect  EKG:  Today 08/20/15-sinus bradycardia with sinus arrhythmia, heart rate 58 bpm, left anterior fascicular block 08/15/14-sinus rhythm, sinus arrhythmia, left anterior fascicular block, left ventricular hypertrophy. 09/08/13-sinus rhythm, 67, PAC, QT interval 468 ms.  Echocardiogram: 12/06/13 - Left ventricle: The cavity size was normal. Wall thickness wasincreased in a pattern of moderate LVH. Systolic function wasnormal. The estimated ejection fraction was in the range of 55%to 60%. Wall motion was normal; there were no regional wallmotion abnormalities. Doppler parameters are consistent withabnormal left ventricular relaxation (grade 1 diastolicdysfunction). Doppler parameters are consistent with highventricular filling pressure. - Aortic valve: A bioprosthesis was present. Mean gradient 19mmHg. - Ascending aorta: The ascending aorta was moderately dilated. - Mitral valve: Calcified annulus. - Left atrium: The atrium was mildly dilated.  Labs: Creatinine 0.54, potassium 3.8, hemoglobin 13.1, INR managed by Dr. Nyoka Cowden.    ASSESSMENT AND PLAN:  1. Lower extremity [facility], Dr. Zigmund Daniel. New wound right lower extremity. This occurred at her granddaughter's wedding when getting out of the wheelchair. 2. Bioprosthetic aortic valve  secondary to aortic valve stenosis- severe bioprosthetic valve stenosis Dental antibiotic prophylaxis. Murmur is fairly significant. Stable, no changes. 3. Paroxysmal atrial fibrillation-currently sinus rhythm, low dose sotalol. We discussed the possibility of every 24 hours dosing however she is doing very well on very low dose split dosing at 40 mg twice a day. We will continue. No changes made, Dr. Nyoka Cowden is managing her blood work. 4. Dilated aortic root, ascending, 5.2 x 4.6 cm on CT scan. Measures 2.9 cm at the sinotubular junction we will continue to monitor. Would be high risk for surgical procedure. No evidence of dissection. We discussed clinic previously. 5. Coronary artery disease-single vessel bypass performed in 2005. Stable. 6. Aortic atherosclerosis-seen on CT scan, continue secondary prevention strategy 7. Chronic anticoagulation - coumadin. Dr. Nyoka Cowden. 8. I will see her back in 6  months.    Signed, Candee Furbish, MD [facility]  08/03/2016 3:44 PM

## 2016-08-03 NOTE — Patient Instructions (Signed)

## 2016-08-27 MED FILL — TEMAZEPAM 15 MG CAPSULE: 15 | 90 days supply | Qty: 90 | Fill #0

## 2016-09-08 MED FILL — LIDOCAINE HCL 2% JELLY: 2 | 10 days supply | Qty: 30 | Fill #3

## 2016-09-08 MED FILL — HYDROCODON-APAP 5-325: 5-325 | 40 days supply | Qty: 120 | Fill #0

## 2016-09-23 MED FILL — predniSONE 5 MG TABS: 5 | 90 days supply | Qty: 90 | Fill #4

## 2016-10-11 MED FILL — LIDOCAINE HCL 2% JELLY: 2 | 10 days supply | Qty: 30 | Fill #4

## 2016-10-19 MED FILL — HYDROCODON-APAP 5-325: 5-325 | 40 days supply | Qty: 120 | Fill #0

## 2016-10-25 MED FILL — WARFARIN SODIUM 2.5 MG TAB: 2.5 | 90 days supply | Qty: 120 | Fill #1

## 2016-10-28 MED FILL — AMOXICILLIN 500 MG CAPSULE: 500 | 1 days supply | Qty: 4 | Fill #1

## 2016-11-02 MED FILL — LIDOCAINE HCL 2% JELLY: 2 | 10 days supply | Qty: 30 | Fill #5

## 2016-11-24 MED FILL — LIDOCAINE HCL 2% JELLY: 2 | 10 days supply | Qty: 30 | Fill #6

## 2016-11-24 MED FILL — TEMAZEPAM 15 MG CAPSULE: 15 | 90 days supply | Qty: 90 | Fill #1

## 2016-12-14 MED FILL — LIDOCAINE HCL 2% JELLY: 2 | 10 days supply | Qty: 30 | Fill #7

## 2016-12-23 MED FILL — AMOXICILLIN 500 MG CAPSULE: 500 | 1 days supply | Qty: 4 | Fill #2

## 2016-12-23 MED FILL — LIDOCAINE HCL 2% JELLY: 2 | 10 days supply | Qty: 30 | Fill #8

## 2016-12-23 MED FILL — predniSONE 5 MG TABS: 5 | 90 days supply | Qty: 90 | Fill #0

## 2017-01-04 ENCOUNTER — Ambulatory Visit
Admission: RE | Admit: 2017-01-04 | Discharge: 2017-01-04 | Disposition: A | Discharge status: Home / Self Care | Payer: Medicare Other | Source: Ambulatory Visit | Attending: Dermatology | Admitting: Dermatology

## 2017-01-04 ENCOUNTER — Other Ambulatory Visit: Payer: Self-pay | Admitting: Internal Medicine

## 2017-01-04 ENCOUNTER — Other Ambulatory Visit: Payer: Self-pay | Admitting: Dermatology

## 2017-01-04 DIAGNOSIS — C44729 Squamous cell carcinoma of skin of left lower limb, including hip: Secondary | ICD-10-CM

## 2017-01-05 ENCOUNTER — Other Ambulatory Visit: Payer: Self-pay | Admitting: Dermatology

## 2017-01-05 DIAGNOSIS — C44729 Squamous cell carcinoma of skin of left lower limb, including hip: Secondary | ICD-10-CM

## 2017-01-06 ENCOUNTER — Ambulatory Visit
Admission: RE | Admit: 2017-01-06 | Discharge: 2017-01-06 | Disposition: A | Discharge status: Home / Self Care | Payer: Medicare Other | Source: Ambulatory Visit | Attending: Dermatology | Admitting: Dermatology

## 2017-01-06 DIAGNOSIS — C44729 Squamous cell carcinoma of skin of left lower limb, including hip: Secondary | ICD-10-CM

## 2017-01-27 MED FILL — LIDOCAINE HCL 2% JELLY: 2 | 10 days supply | Qty: 30 | Fill #9

## 2017-02-07 MED FILL — LIDOCAINE HCL 2% JELLY: 2 | 10 days supply | Qty: 30 | Fill #10

## 2017-02-07 MED FILL — WARFARIN SODIUM 2.5 MG TAB: 2.5 | 90 days supply | Qty: 120 | Fill #2

## 2017-02-21 MED FILL — TEMAZEPAM 15 MG CAPSULE: 15 | 90 days supply | Qty: 90 | Fill #0

## 2017-03-02 ENCOUNTER — Ambulatory Visit: Payer: Medicare Other | Admitting: Cardiology

## 2017-03-24 ENCOUNTER — Encounter: Payer: Self-pay | Admitting: Cardiology

## 2017-03-24 ENCOUNTER — Ambulatory Visit: Payer: Medicare Other | Admitting: Cardiology

## 2017-03-24 VITALS — BP 114/68 | HR 72 | Ht <= 58 in | Wt 104.8 lb

## 2017-03-24 DIAGNOSIS — I2510 Atherosclerotic heart disease of native coronary artery without angina pectoris: Secondary | ICD-10-CM

## 2017-03-24 DIAGNOSIS — I77810 Thoracic aortic ectasia: Secondary | ICD-10-CM | POA: Diagnosis not present

## 2017-03-24 DIAGNOSIS — I480 Paroxysmal atrial fibrillation: Secondary | ICD-10-CM | POA: Diagnosis not present

## 2017-03-24 MED FILL — predniSONE 5 MG TABS: 5 | 90 days supply | Qty: 90 | Fill #1

## 2017-03-24 NOTE — Patient Instructions (Signed)

## 2017-03-24 NOTE — Progress Notes (Signed)
[place]. [address] [place], [place]  [address] Phone: [phone] Fax:  [phone]  Date:  03/24/2017   ID:  [name], DOB 08-25-1922, MRN [id number]  PCP:  Levin Erp, MD   History of Present Illness: [name] is a 84 y.o. female here for CAD, edema follow up.  In July of 2005 she underwent aortic valve replacement with single vessel bypass in [place]. Bioprosthetic aortic valve. She also has peripheral vascular disease with left SFA stent placed in [place] in 2005. She is currently following with Dr. Scot Dock. [name] in future according to daughter.   [name] referred to [facility]. Dr. Zigmund Daniel wound center. Used pressure boots daily, legs wrapped. They're very pleased with her progress.  Several years ago she was diagnosed with atrial fibrillation. She is currently on chronic anticoagulation followed by Dr. Nyoka Cowden. She has been maintained on sotalol currently very low-dose 40 mg twice a day. QT interval normal.  She has noted some mild increased shortness of breath, trouble getting air out. No chest pain.  Last EKG demonstrated sinus rhythm.  Daughter is friends with Dr. Ron Parker, she is wife of local retired Paediatric nurse.   [name]. Also has compression fractures of vertebral bodies. Chronic back pain.  08/03/16-overall doing well. New right leg wound. Getting attention for this. She was getting out of wheelchair at her granddaughter's wedding and cut her leg. She is not having any chest pain, no change in shortness of breath. No syncope.  03/24/17 -she had debridement of left leg, wound VAC in place, had 5-FU injections.  Overall no cardiac complaints.  No shortness of breath, no syncope, no bleeding.   Wt Readings from Last 3 Encounters:  03/24/17 104 lb 12.8 oz (47.5 kg)  08/03/16 104 lb 6 oz (47.3 kg)  02/19/16 104 lb (47.2 kg)     Past Medical History:  Diagnosis Date  . Atrial fibrillation ([place])   . Colon polyps   . Coronary artery disease   .  DJD (degenerative joint disease)   . DJD (degenerative joint disease)   . Hyperlipidemia   . Kidney stone   . Osteoporosis   . PUD (peptic ulcer disease)   . Thoracic aortic aneurysm ([place])   . Thyroid disease    Hypothyroidism    Past Surgical History:  Procedure Laterality Date  . AORTIC VALVE REPLACEMENT  03/29/2003  . CARDIAC VALVE REPLACEMENT     aortic valve  . CORONARY ARTERY BYPASS GRAFT  03/29/2003  . FEMORAL ARTERY STENT Left 04/2003  . RENAL ARTERY STENT      Current Outpatient Medications  Medication Sig Dispense Refill  . amoxicillin (AMOXIL) 500 MG capsule Take 4 capsules by mouth as needed for painful procedure/intervention.    Marland Kitchen aspirin 81 MG tablet Take 81 mg by mouth every other day.    . Hydrocodone-Acetaminophen 5-300 MG TABS Take 5-300 mg by mouth 3 (three) times daily.     Marland Kitchen levothyroxine (SYNTHROID, LEVOTHROID) 88 MCG tablet Take 88 mcg by mouth daily.    . predniSONE (DELTASONE) 5 MG tablet Take 5 mg by mouth daily with breakfast.     . sotalol (BETAPACE) 80 MG tablet Take 40 mg by mouth 2 (two) times daily.    . temazepam (RESTORIL) 15 MG capsule Take 15 mg by mouth at bedtime.    Marland Kitchen warfarin (COUMADIN) 2.5 MG tablet Take 2.5 mg by mouth See admin instructions. Take 2.5 by mouth everyday  No current facility-administered medications for this visit.     Allergies:    Allergies  Allergen Reactions  . Ciprofloxacin Diarrhea  . Shrimp [Shellfish Allergy] Hives  . Sulfa Antibiotics Rash    AND HIVES  . Sulfasalazine Rash    AND HIVES AND HIVES    Social History:  The patient  reports that  has never smoked. she has never used smokeless tobacco. She reports that she does not drink alcohol or use drugs.   Family History  Problem Relation Age of Onset  . Heart disease Mother   . Hypertension Mother   . Dementia Mother   . Heart disease Father   . Hypertension Father        Before age 15  . Heart attack Father        X ' s   2-3  . Heart  disease Brother   . Hypertension Brother   . Diabetes Daughter   . Stroke Neg Hx     ROS:  Please see the history of present illness.   Denies any syncope, bleeding, orthopnea, PND. Positive for weeping lower extremities, improving. Occasional excoriations on skin.  All other systems reviewed and negative.   PHYSICAL EXAM: VS:  BP 114/68   Pulse 72   Ht 4\' 10"  (1.473 m)   Wt 104 lb 12.8 oz (47.5 kg)   SpO2 96%   BMI 21.90 kg/m  GEN: Thin in no acute distress  HEENT: normal  Neck: no JVD, carotid bruits, or masses Cardiac: RRR; 2/6 SM, no rubs, or gallops,no edema  Respiratory:  clear to auscultation bilaterally, normal work of breathing GI: soft, nontender, nondistended, + BS MS: no deformity or atrophy  Skin: wound vac Neuro:  Alert and Oriented x 3, Strength and sensation are intact Psych: euthymic mood, full affect   EKG:  Today 03/24/17 -03/24/17 sinus rhythm with premature atrial contractions, left anterior fascicular block, nonspecific ST-T wave changes, LVH.  Personally viewed 08/20/15-sinus bradycardia with sinus arrhythmia, heart rate 58 bpm, left anterior fascicular block 08/15/14-sinus rhythm, sinus arrhythmia, left anterior fascicular block, left ventricular hypertrophy. 09/08/13-sinus rhythm, 67, PAC, QT interval 468 ms.  Echocardiogram: 12/06/13 - Left ventricle: The cavity size was normal. Wall thickness wasincreased in a pattern of moderate LVH. Systolic function wasnormal. The estimated ejection fraction was in the range of 55%to 60%. Wall motion was normal; there were no regional wallmotion abnormalities. Doppler parameters are consistent withabnormal left ventricular relaxation (grade 1 diastolicdysfunction). Doppler parameters are consistent with highventricular filling pressure. - Aortic valve: A bioprosthesis was present. Mean gradient 28mmHg. - Ascending aorta: The ascending aorta was moderately dilated. - Mitral valve: Calcified annulus. - Left atrium:  The atrium was mildly dilated.  Labs: Creatinine 0.54, potassium 3.8, hemoglobin 13.1, INR managed by Dr. Nyoka Cowden.    ASSESSMENT AND PLAN:  1. Lower extremity wounds, squamous cell carcinoma [facility], Dr. Zigmund Daniel. New wound right lower extremity. This occurred at her granddaughter's wedding when getting out of the wheelchair.  Used topical 5 fluorouracil injections.  Negative pressure wound post debridement 2. Bioprosthetic aortic valve secondary to aortic valve stenosis- severe bioprosthetic valve stenosis Dental antibiotic prophylaxis. Murmur is fairly significant.  She would be very high risk for any surgical treatment.  This is not desired at this time.  With infection, this would not be an option 3. Paroxysmal atrial fibrillation-currently sinus rhythm, low dose sotalol. We discussed the possibility of every 24 hours dosing however she is doing very well on very  low dose split dosing at 40 mg twice a day.  Dr. Nyoka Cowden is managing her blood work.  No change in management 4. Dilated aortic root, ascending, 5.2 x 4.6 cm on CT scan. Measures 2.9 cm at the sinotubular junction we will continue to monitor. Would be high risk for surgical procedure. No evidence of dissection.  We will forego any further CT of her chest given the fact that she would not be a surgical candidate nor does she desire to have any surgery at this time. 5. Coronary artery disease-single vessel bypass performed in 2005. Stable.  No anginal symptoms, stable 6. aortic atherosclerosis-seen on CT scan, continue secondary prevention strategy, no change 7. Chronic anticoagulation, atrial fibrillation- coumadin. Dr. Nyoka Cowden.  No bleeding 8. I will see her back in 12 months.    Signed, Candee Furbish, MD [facility]  03/24/2017 2:55 PM

## 2017-04-14 MED FILL — CLINDAMYCIN HCL 300 MG CAPS: 300 | 7 days supply | Qty: 21 | Fill #0

## 2017-04-15 MED FILL — GABAPENTIN 300 MG CAPSULE: 300 | 30 days supply | Qty: 120 | Fill #0

## 2017-04-15 MED FILL — CELECOXIB 200 MG CAP: 200 | 14 days supply | Qty: 28 | Fill #0

## 2017-04-18 MED FILL — AMOX-CLAV 875-125 MG TABLET: 875-125 | 14 days supply | Qty: 28 | Fill #0

## 2017-04-28 ENCOUNTER — Emergency Department (HOSPITAL_COMMUNITY)
Admission: EM | Admit: 2017-04-28 | Discharge: 2017-04-28 | Discharge status: Against Medical Advice | Payer: Medicare Other

## 2017-04-29 ENCOUNTER — Encounter: Payer: Self-pay | Admitting: Adult Health

## 2017-04-29 ENCOUNTER — Ambulatory Visit: Payer: Medicare Other

## 2017-04-29 ENCOUNTER — Telehealth: Payer: Self-pay

## 2017-04-29 ENCOUNTER — Inpatient Hospital Stay: Payer: Medicare Other | Attending: Adult Health | Admitting: Adult Health

## 2017-04-29 ENCOUNTER — Inpatient Hospital Stay: Payer: Medicare Other

## 2017-04-29 ENCOUNTER — Other Ambulatory Visit: Payer: Self-pay | Admitting: Oncology

## 2017-04-29 ENCOUNTER — Other Ambulatory Visit: Payer: Self-pay

## 2017-04-29 VITALS — BP 138/67 | HR 69 | Temp 98.3°F | Resp 16

## 2017-04-29 DIAGNOSIS — I77810 Thoracic aortic ectasia: Secondary | ICD-10-CM

## 2017-04-29 DIAGNOSIS — I700 Atherosclerosis of aorta: Secondary | ICD-10-CM

## 2017-04-29 DIAGNOSIS — I7025 Atherosclerosis of native arteries of other extremities with ulceration: Secondary | ICD-10-CM

## 2017-04-29 DIAGNOSIS — I480 Paroxysmal atrial fibrillation: Secondary | ICD-10-CM

## 2017-04-29 DIAGNOSIS — D539 Nutritional anemia, unspecified: Secondary | ICD-10-CM

## 2017-04-29 DIAGNOSIS — D509 Iron deficiency anemia, unspecified: Secondary | ICD-10-CM | POA: Insufficient documentation

## 2017-04-29 DIAGNOSIS — Z953 Presence of xenogenic heart valve: Secondary | ICD-10-CM

## 2017-04-29 DIAGNOSIS — I739 Peripheral vascular disease, unspecified: Secondary | ICD-10-CM

## 2017-04-29 LAB — COMPREHENSIVE METABOLIC PANEL
ALBUMIN: 3.3 g/dL — AB (ref 3.5–5.0)
ALK PHOS: 64 U/L (ref 40–150)
ALT: 6 U/L (ref 0–55)
ANION GAP: 9 (ref 3–11)
AST: 14 U/L (ref 5–34)
BUN: 15 mg/dL (ref 7–26)
CALCIUM: 8.6 mg/dL (ref 8.4–10.4)
CO2: 28 mmol/L (ref 22–29)
Chloride: 102 mmol/L (ref 98–109)
Creatinine, Ser: 0.66 mg/dL (ref 0.60–1.10)
GFR calc Af Amer: >60 mL/min (ref >60)
GFR calc non Af Amer: >60 mL/min (ref >60)
Glucose, Bld: 110 mg/dL (ref 70–140)
Potassium: 4 mmol/L (ref 3.5–5.1)
Sodium: 139 mmol/L (ref 136–145)
Total Bilirubin: 0.3 mg/dL (ref 0.2–1.2)
Total Protein: 6.2 g/dL — ABNORMAL LOW (ref 6.4–8.3)

## 2017-04-29 LAB — CBC WITH DIFFERENTIAL/PLATELET
Basophils Absolute: 0 K/uL (ref 0.0–0.1)
Basophils Relative: 0 %
Eosinophils Absolute: 0.1 K/uL (ref 0.0–0.5)
Eosinophils Relative: 1 %
HCT: 20.3 % — ABNORMAL LOW (ref 34.8–46.6)
Hemoglobin: 5.7 g/dL — CL (ref 11.6–15.9)
Lymphocytes Relative: 9 %
Lymphs Abs: 1 K/uL (ref 0.9–3.3)
MCH: 21 pg — ABNORMAL LOW (ref 25.1–34.0)
MCHC: 28.1 g/dL — ABNORMAL LOW (ref 31.5–36.0)
MCV: 74.6 fL — ABNORMAL LOW (ref 79.5–101.0)
Monocytes Absolute: 0.8 K/uL (ref 0.1–0.9)
Monocytes Relative: 8 %
Neutro Abs: 9.2 K/uL — ABNORMAL HIGH (ref 1.5–6.5)
Neutrophils Relative %: 82 %
Platelets: 443 K/uL — ABNORMAL HIGH (ref 145–400)
RBC: 2.72 MIL/uL — ABNORMAL LOW (ref 3.70–5.45)
RDW: 16.7 % — ABNORMAL HIGH (ref 11.2–14.5)
WBC: 11.1 K/uL — ABNORMAL HIGH (ref 3.9–10.3)

## 2017-04-29 LAB — RETICULOCYTES
RBC.: 2.72 MIL/uL — AB (ref 3.70–5.45)
RETIC CT PCT: 3.6 % — AB (ref 0.7–2.1)
Retic Count, Absolute: 97.9 10*3/uL — ABNORMAL HIGH (ref 33.7–90.7)

## 2017-04-29 LAB — DIRECT ANTIGLOBULIN TEST (NOT AT ARMC)
DAT, IgG: NEGATIVE
DAT, complement: NEGATIVE

## 2017-04-29 LAB — SAVE SMEAR

## 2017-04-29 LAB — LACTATE DEHYDROGENASE: LDH: 233 U/L (ref 125–245)

## 2017-04-29 LAB — VITAMIN B12: Vitamin B-12: 410 pg/mL (ref 180–914)

## 2017-04-29 LAB — FOLATE: Folate: 7.8 ng/mL (ref >5.9)

## 2017-04-29 NOTE — Progress Notes (Addendum)
[place]  [phone] [phone]     ID: [name] DOB: October 31, 1922  MR#: [id number]  NHA#:[id number]  Patient Care Team: Levin Erp, MD as PCP - General (Internal Medicine) Scot Dock, NP OTHER MD:  CHIEF COMPLAINT: anemia  CURRENT TREATMENT: transfusion pending   HISTORY OF CURRENT ILLNESS: [name] is here today accompanied by her son and daughters.  She has had a progressive fatigue and has slowly declined in how she feels.  She saw her PCP who told her she was anemic with a hemoglobin of 5.5.    She underwent MOHS surgery on her leg a few weeks ago.  They could not do a skin graft to the area due to inability to put her under general anesthesia.  So instead of a skin graft, they placed a wound vac.  She says that she felt like the canister had to be emptied a lot, and that there was blood in the canister.  This was unfortunately complicated by the tendon becoming exposed and an infection in the area requiring antibiotics.  [name] has a h/o cardiac issues and venous insufficiency which has contributed to her healing issues.  In review of care everywhere, [name] is well known to the wound clinic at [facility] for her wound healing issues due to bilateral lower extremity ulcers.    She has noted no blood in her stool, black tarry stool.  She can't remember when she had her last colonoscopy, however when she was having colonoscopies, she did have polyps removed.     The patient's subsequent history is as detailed below.  INTERVAL HISTORY: [name]'s PCP has been having a hard time finding somewhere for [facility] to receive blood.  She went to the ER two days in a row, however one day the wait was 6 hours, and the next 11.  This was not a reasonable option considering the flu outbreak in our community right now, and the wait time for her.  She is here today for evaluation of her anemia.  She is fatigued.    REVIEW OF SYSTEMS: [name] is fatigued.  She is  mildly short of breath.  She is dizzy with too much activity.  She does not have chest pain.  She has some taste changes and pain in her mouth.  Her tongue is slightly bigger and sore on the right side.  She has had some night sweats earlier this week.  She denies weight loss.    PAST MEDICAL HISTORY: Past Medical History:  Diagnosis Date  . Atrial fibrillation ([place])   . Colon polyps   . Coronary artery disease   . DJD (degenerative joint disease)   . DJD (degenerative joint disease)   . Hyperlipidemia   . Kidney stone   . Osteoporosis   . PUD (peptic ulcer disease)   . Thoracic aortic aneurysm ([place])   . Thyroid disease    Hypothyroidism    PAST SURGICAL HISTORY: Past Surgical History:  Procedure Laterality Date  . AORTIC VALVE REPLACEMENT  03/29/2003  . CARDIAC VALVE REPLACEMENT     aortic valve  . CORONARY ARTERY BYPASS GRAFT  03/29/2003  . FEMORAL ARTERY STENT Left 04/2003  . RENAL ARTERY STENT      FAMILY HISTORY Family History  Problem Relation Age of Onset  . Heart disease Mother   . Hypertension Mother   . Dementia Mother   . Heart disease Father   . Hypertension Father  Before age 69  . Heart attack Father        X ' s   2-3  . Heart disease Brother   . Hypertension Brother   . Diabetes Daughter   . Stroke Neg Hx     SOCIAL HISTORY: Never smoker, Does not drink.      ADVANCED DIRECTIVES:    HEALTH MAINTENANCE: Sees dermatology, has stopped colon cancer screening/pap screening Social History   Tobacco Use  . Smoking status: Never Smoker  . Smokeless tobacco: Never Used  Substance Use Topics  . Alcohol use: No  . Drug use: No     Colonoscopy:  PAP:  Bone density:   Allergies  Allergen Reactions  . Ciprofloxacin Diarrhea  . Shrimp [Shellfish Allergy] Hives  . Sulfa Antibiotics Rash    AND HIVES  . Sulfasalazine Rash    AND HIVES AND HIVES    Current Outpatient Medications  Medication Sig Dispense Refill  . amoxicillin  (AMOXIL) 500 MG capsule Take 4 capsules by mouth as needed for painful procedure/intervention.    Marland Kitchen amoxicillin-clavulanate (AUGMENTIN) 875-125 MG tablet TAKE 1 TABLET BY MOUTH 2 TIMES A DAY FOR 14 DAYS  0  . aspirin 81 MG tablet Take 81 mg by mouth every other day.    . Hydrocodone-Acetaminophen 5-300 MG TABS Take 5-300 mg by mouth 3 (three) times daily.     Marland Kitchen levothyroxine (SYNTHROID, LEVOTHROID) 88 MCG tablet Take 88 mcg by mouth daily.    . predniSONE (DELTASONE) 5 MG tablet Take 5 mg by mouth daily with breakfast.     . sotalol (BETAPACE) 80 MG tablet Take 40 mg by mouth 2 (two) times daily.    . temazepam (RESTORIL) 15 MG capsule Take 15 mg by mouth at bedtime.    Marland Kitchen warfarin (COUMADIN) 2.5 MG tablet Take 2.5 mg by mouth See admin instructions. Take 2.5 by mouth everyday     No current facility-administered medications for this visit.     OBJECTIVE:  Vitals:   04/29/17 1618  BP: 138/67  Pulse: 69  Resp: 16  Temp: 98.3 F (36.8 C)  SpO2: 100%     There is no height or weight on file to calculate BMI.   Wt Readings from Last 3 Encounters:  03/24/17 104 lb 12.8 oz (47.5 kg)  08/03/16 104 lb 6 oz (47.3 kg)  02/19/16 104 lb (47.2 kg)     ECOG FS:3 - Symptomatic, >50% confined to bed GENERAL: Patient is a tired older woman sitting in wheelchair HEENT:  Sclerae anicteric. One small ulceration on right lateral tongue. No evidence of oropharyngeal candidiasis. Neck is supple.  NODES:  No cervical, supraclavicular, or axillary lymphadenopathy palpated.  LUNGS:  Clear to auscultation bilaterally.  No wheezes or rhonchi. HEART:  Regular rate and rhythm. +  murmur. ABDOMEN:  Soft, nontender.  Positive, normoactive bowel sounds. No organomegaly palpated. MSK:  No focal spinal tenderness to palpation. Full range of motion bilaterally in the upper extremities. EXTREMITIES:  No peripheral edema.   SKIN:  Clear with no obvious rashes or skin changes. No nail dyscrasia. Skin splitting in  fingertips NEURO:  Nonfocal. Well oriented.  Appropriate affect.     LAB RESULTS:  CMP     Component Value Date/Time   NA 143 10/08/2013 0525   K 3.8 10/08/2013 0525   CL 103 10/08/2013 0525   CO2 26 10/08/2013 0525   GLUCOSE 120 (H) 10/08/2013 0525   BUN 15 10/08/2013 0525   CREATININE  0.54 10/08/2013 0525   CALCIUM 8.6 10/08/2013 0525   GFRNONAA 81 (L) 10/08/2013 0525   GFRAA >90 10/08/2013 0525    No results found for: TOTALPROTELP, ALBUMINELP, A1GS, A2GS, BETS, BETA2SER, GAMS, MSPIKE, SPEI  No results found for: KPAFRELGTCHN, LAMBDASER, [facility]  Lab Results  Component Value Date   WBC 11.1 (H) 04/29/2017   NEUTROABS 9.2 (H) 04/29/2017   HGB 5.7 (LL) 04/29/2017   HCT 20.3 (L) 04/29/2017   MCV 74.6 (L) 04/29/2017   PLT 443 (H) 04/29/2017      Chemistry      Component Value Date/Time   NA 143 10/08/2013 0525   K 3.8 10/08/2013 0525   CL 103 10/08/2013 0525   CO2 26 10/08/2013 0525   BUN 15 10/08/2013 0525   CREATININE 0.54 10/08/2013 0525      Component Value Date/Time   CALCIUM 8.6 10/08/2013 0525       No results found for: LABCA2  No components found for: GLOVFI433  No results for input(s): INR in the last 168 hours.  No results found for: LABCA2  No results found for: IRJ188  No results found for: CZY606  No results found for: TKZ601  No results found for: CA2729  No components found for: HGQUANT  No results found for: CEA1 / No results found for: CEA1   No results found for: AFPTUMOR  No results found for: CHROMOGRNA  No results found for: PSA1  Appointment on 04/29/2017  Component Date Value Ref Range Status  . WBC 04/29/2017 11.1* 3.9 - 10.3 K/uL Final  . RBC 04/29/2017 2.72* 3.70 - 5.45 MIL/uL Final  . Hemoglobin 04/29/2017 5.7* 11.6 - 15.9 g/dL Final   CRITICAL RESULT CALLED TO, READ BACK BY AND VERIFIED WITH: Tiffany, RN   . HCT 04/29/2017 20.3* 34.8 - 46.6 % Final  . MCV 04/29/2017 74.6* 79.5 - 101.0 fL Final  .  MCH 04/29/2017 21.0* 25.1 - 34.0 pg Final  . MCHC 04/29/2017 28.1* 31.5 - 36.0 g/dL Final  . RDW 04/29/2017 16.7* 11.2 - 14.5 % Final  . Platelets 04/29/2017 443* 145 - 400 K/uL Final  . Neutrophils Relative % 04/29/2017 82  % Final  . Neutro Abs 04/29/2017 9.2* 1.5 - 6.5 K/uL Final  . Lymphocytes Relative 04/29/2017 9  % Final  . Lymphs Abs 04/29/2017 1.0  0.9 - 3.3 K/uL Final  . Monocytes Relative 04/29/2017 8  % Final  . Monocytes Absolute 04/29/2017 0.8  0.1 - 0.9 K/uL Final  . Eosinophils Relative 04/29/2017 1  % Final  . Eosinophils Absolute 04/29/2017 0.1  0.0 - 0.5 K/uL Final  . Basophils Relative 04/29/2017 0  % Final  . Basophils Absolute 04/29/2017 0.0  0.0 - 0.1 K/uL Final   Performed at [facility], [place] [address] [place], [place] [address]  . Retic Ct Pct 04/29/2017 3.6* 0.7 - 2.1 % Final  . RBC. 04/29/2017 2.72* 3.70 - 5.45 MIL/uL Final  . Retic Count, Absolute 04/29/2017 97.9* 33.7 - 90.7 K/uL Final   Performed at [facility], [place] [address] [place], [place] [address]    (this displays the last labs from the last 3 days)  No results found for: TOTALPROTELP, ALBUMINELP, A1GS, A2GS, BETS, BETA2SER, GAMS, MSPIKE, SPEI (this displays SPEP labs)  No results found for: KPAFRELGTCHN, LAMBDASER, KAPLAMBRATIO (kappa/lambda light chains)  No results found for: HGBA, HGBA2QUANT, HGBFQUANT, HGBSQUAN (Hemoglobinopathy evaluation)   No results found for: LDH  No results found  for: IRON, TIBC, IRONPCTSAT (Iron and TIBC)  No results found for: FERRITIN  Urinalysis No results found for: COLORURINE, APPEARANCEUR, LABSPEC, PHURINE, GLUCOSEU, HGBUR, BILIRUBINUR, KETONESUR, PROTEINUR, UROBILINOGEN, NITRITE, LEUKOCYTESUR   STUDIES: No results found.    ASSESSMENT: 84 y.o. woman with h/o a fib, PAD, here for evaluation of anemia of unknown etiology.  (1) iron deficiency anemia  (a) to receive 2 units PRBCs  04/30/2017  (b) feraheme 02/14 and 05/12/2017  (2) nonspecific cold antibody  (a) requires blood warmer with transfusions  (3) nonhealing ulcer LLE following Mohs surgery: followed at [place]:  [name] and I reviewed her labs and the fact that she is anemic.  She needs to get a blood transfusion.  She is exhausted and ready to receive the blood.  I reviewed with her the process of receiving blood tomorrow.  I reviewed possible side effects.  She and her family will meet with Dr. Jana Hakim in the morning.  At that time there will be more results back for him to review with her.  I also showed the patient around to the treatment area so their family would know where to go.  Dr. Jana Hakim, who was not in the office at the time of the visit, was available by phone.  I added on a blood smear and let Dr. Jana Hakim know it would be in the lab for him to review.  He will see the patient in the morning and addend this note/encounter with updates.    [name] has a good understanding of the overall plan. She agrees with it. She will call with any problems that may develop before her next visit here.  I encouraged her family to bring her to the Emergency Room if her symptoms worsen in any way, shape, or form.    Scot Dock, NP   04/29/2017 4:20 PM Medical Oncology and Hematology [facility] [address] [place], [place] [address] Tel. [phone]    Fax. [phone]   ADDENDUM: 84 year old [place] resident complaining of weakness and confusion and found to have a hemoglobin of 5.6 with an MCV of 78 by her primary care physician Dr. Nyoka Cowden when he saw her on 04/26/2017.  The white cell count was 13.9 and the platelet count 509,000.  Note that the patient had a hemoglobin of 13.1 and an MCV of 92.9 three years ago, with a platelet count of 242,000.   Labs obtained here yesterday showed the hemoglobin to be 5.7, MCV 74.6 and reticulocyte count 97.9.Marland Kitchen  The white cell count was 11.1,  with an absolute neutrophil count of 9.2, and a platelet count was 443,000.  There is no evidence of hemolysis, with a negative DAT for antibody and complement and a normal LDH at 233, as well as a normal total bilirubin at 0.3.  Type and screen shows a nonspecific cold antibody.  Folate was normal at 7.8 and B12 also normal at 410.  Unfortunately I do not have access to the blood film this morning as the lab is locked.  Also the ferritin is pending.  The overall picture is most consistent with iron deficiency anemia.  The patient has a history of long-standing internal hemorrhoids and frequently sees a little bit of bright red blood on the tissue.  She also has had a nonhealing ulcer in the left lower leg following Mohs surgery.  There has been no other source of overt bleeding and specifically no epistaxis, no easy bruising, no melena, and  no hematochezia.  She has not noted any blood in her urine.  She has a nonspecific cold antibody noted on type and screen.  This is not causing however cold agglutinin disease as in that case we would see a high MCV instead of what we are actually seeing, which is microcytosis.  We are proceeding with transfusion of 2 units of packed red cells today, and she will receive Lasix after the first unit.  We are going to set her up for Feraheme next week and the following week.  She will then see me in approximately 6 weeks to make sure the problem has resolved.  I anticipate she can return to her primary care physician for further follow-up after that visit  I met with the patient and her daughter and son-in-law and discussed their questions and concerns.       I personally saw this patient and performed a substantive portion of this encounter with the listed APP documented above.   Chauncey Cruel, MD Medical Oncology and Hematology [facility] [address] [place], [place] [address] Tel. [phone]    Fax. [phone]

## 2017-04-29 NOTE — Telephone Encounter (Signed)
Pt is coming today for type and screen and to see Wilber Bihari, NP. Per Dr. Jana Hakim she is to receive 2 units of blood tomorrow. Appointment has been added and orders placed. Infusion aware. Patient and family aware of appointments. He said he will see pt tomorrow morning in infusion as well.  Cyndia Bent RN

## 2017-04-29 NOTE — Progress Notes (Signed)
I was just called by Dr. Denna Haggard, Ms Mangal's son in law, to tell me this 84 y/o woman recently seen at [facility] for Mohs surgery (with currently a non-healing ulcer to her leg) was feeling tired and confused and saw Dr Zada Girt her PCP, who obtained a CBC

## 2017-04-30 ENCOUNTER — Other Ambulatory Visit: Payer: Self-pay | Admitting: Oncology

## 2017-04-30 ENCOUNTER — Inpatient Hospital Stay: Payer: Medicare Other

## 2017-04-30 DIAGNOSIS — D509 Iron deficiency anemia, unspecified: Secondary | ICD-10-CM | POA: Diagnosis not present

## 2017-04-30 DIAGNOSIS — D539 Nutritional anemia, unspecified: Secondary | ICD-10-CM

## 2017-04-30 DIAGNOSIS — D508 Other iron deficiency anemias: Secondary | ICD-10-CM

## 2017-04-30 LAB — SAMPLE TO BLOOD BANK

## 2017-04-30 MED ORDER — SODIUM CHLORIDE 0.9 % IV SOLN
250.0000 mL | Freq: Once | INTRAVENOUS | Status: AC
  Administered 2017-04-30: 250 mL via INTRAVENOUS

## 2017-04-30 MED ORDER — ACETAMINOPHEN 325 MG PO TABS
ORAL_TABLET | ORAL | Status: AC
  Filled 2017-04-30: qty 2

## 2017-04-30 MED ORDER — DIPHENHYDRAMINE HCL 25 MG PO CAPS
25.0000 mg | ORAL_CAPSULE | Freq: Once | ORAL | Status: AC
  Administered 2017-04-30: 25 mg via ORAL

## 2017-04-30 MED ORDER — FUROSEMIDE 10 MG/ML IJ SOLN: 10.0000 mg | Freq: Once | INTRAMUSCULAR | Status: DC

## 2017-04-30 MED ORDER — FUROSEMIDE 10 MG/ML IJ SOLN
10.0000 mg | Freq: Once | INTRAMUSCULAR | Status: AC
  Administered 2017-04-30: 10 mg via INTRAVENOUS

## 2017-04-30 MED ORDER — DIPHENHYDRAMINE HCL 25 MG PO CAPS
ORAL_CAPSULE | ORAL | Status: AC
  Filled 2017-04-30: qty 1

## 2017-04-30 MED ORDER — ACETAMINOPHEN 325 MG PO TABS
650.0000 mg | ORAL_TABLET | Freq: Once | ORAL | Status: AC
  Administered 2017-04-30: 650 mg via ORAL

## 2017-04-30 MED ORDER — FUROSEMIDE 10 MG/ML IJ SOLN
INTRAMUSCULAR | Status: AC
  Filled 2017-04-30: qty 2

## 2017-04-30 NOTE — Progress Notes (Signed)
[name]  [phone] [phone]     ID: [name] DOB: 07/11/1922  MR#: [id number]  RCV#:[id number]  Patient Care Team: Levin Erp, MD as PCP - General (Internal Medicine) Chauncey Cruel, MD OTHER MD:  CHIEF COMPLAINT: anemia  CURRENT TREATMENT: transfusion pending   HISTORY OF CURRENT ILLNESS: [name] is here today accompanied by her son and daughters.  She has had a progressive fatigue and has slowly declined in how she feels.  She saw her PCP who told her she was anemic with a hemoglobin of 5.5.    She underwent MOHS surgery on her leg a few weeks ago.  They could not do a skin graft to the area due to inability to put her under general anesthesia.  So instead of a skin graft, they placed a wound vac.  She says that she felt like the canister had to be emptied a lot, and that there was blood in the canister.  This was unfortunately complicated by the tendon becoming exposed and an infection in the area requiring antibiotics.  [name] has a h/o cardiac issues and venous insufficiency which has contributed to her healing issues.  In review of care everywhere, [name] is well known to the wound clinic at [facility] for her wound healing issues due to bilateral lower extremity ulcers.    She has noted no blood in her stool, black tarry stool.  She can't remember when she had her last colonoscopy, however when she was having colonoscopies, she did have polyps removed.     The patient's subsequent history is as detailed below.  INTERVAL HISTORY: [name]'s PCP has been having a hard time finding somewhere for [name] to receive blood.  She went to the ER two days in a row, however one day the wait was 6 hours, and the next 11.  This was not a reasonable option considering the flu outbreak in our community right now, and the wait time for her.  She is here today for evaluation of her anemia.  She is fatigued.    REVIEW OF SYSTEMS: [name] is fatigued.  She is  mildly short of breath.  She is dizzy with too much activity.  She does not have chest pain.  She has some taste changes and pain in her mouth.  Her tongue is slightly bigger and sore on the right side.  She has had some night sweats earlier this week.  She denies weight loss.    PAST MEDICAL HISTORY: Past Medical History:  Diagnosis Date  . Atrial fibrillation ([place])   . Colon polyps   . Coronary artery disease   . DJD (degenerative joint disease)   . DJD (degenerative joint disease)   . Hyperlipidemia   . Kidney stone   . Osteoporosis   . PUD (peptic ulcer disease)   . Thoracic aortic aneurysm ([name])   . Thyroid disease    Hypothyroidism    PAST SURGICAL HISTORY: Past Surgical History:  Procedure Laterality Date  . AORTIC VALVE REPLACEMENT  03/29/2003  . CARDIAC VALVE REPLACEMENT     aortic valve  . CORONARY ARTERY BYPASS GRAFT  03/29/2003  . FEMORAL ARTERY STENT Left 04/2003  . RENAL ARTERY STENT      FAMILY HISTORY Family History  Problem Relation Age of Onset  . Heart disease Mother   . Hypertension Mother   . Dementia Mother   . Heart disease Father   . Hypertension Father  Before age 14  . Heart attack Father        X ' s   2-3  . Heart disease Brother   . Hypertension Brother   . Diabetes Daughter   . Stroke Neg Hx     SOCIAL HISTORY: Never smoker, Does not drink.      ADVANCED DIRECTIVES:    HEALTH MAINTENANCE: Sees dermatology, has stopped colon cancer screening/pap screening Social History   Tobacco Use  . Smoking status: Never Smoker  . Smokeless tobacco: Never Used  Substance Use Topics  . Alcohol use: No  . Drug use: No     Colonoscopy:  PAP:  Bone density:   Allergies  Allergen Reactions  . Ciprofloxacin Diarrhea  . Shrimp [Shellfish Allergy] Hives  . Sulfa Antibiotics Rash    AND HIVES  . Sulfasalazine Rash    AND HIVES AND HIVES    Current Outpatient Medications  Medication Sig Dispense Refill  . amoxicillin  (AMOXIL) 500 MG capsule Take 4 capsules by mouth as needed for painful procedure/intervention.    Marland Kitchen amoxicillin-clavulanate (AUGMENTIN) 875-125 MG tablet TAKE 1 TABLET BY MOUTH 2 TIMES A DAY FOR 14 DAYS  0  . aspirin 81 MG tablet Take 81 mg by mouth every other day.    . Hydrocodone-Acetaminophen 5-300 MG TABS Take 5-300 mg by mouth 3 (three) times daily.     Marland Kitchen levothyroxine (SYNTHROID, LEVOTHROID) 88 MCG tablet Take 88 mcg by mouth daily.    . predniSONE (DELTASONE) 5 MG tablet Take 5 mg by mouth daily with breakfast.     . sotalol (BETAPACE) 80 MG tablet Take 40 mg by mouth 2 (two) times daily.    . temazepam (RESTORIL) 15 MG capsule Take 15 mg by mouth at bedtime.    Marland Kitchen warfarin (COUMADIN) 2.5 MG tablet Take 2.5 mg by mouth See admin instructions. Take 2.5 by mouth everyday     No current facility-administered medications for this visit.     OBJECTIVE:  There were no vitals filed for this visit.   There is no height or weight on file to calculate BMI.   Wt Readings from Last 3 Encounters:  03/24/17 104 lb 12.8 oz (47.5 kg)  08/03/16 104 lb 6 oz (47.3 kg)  02/19/16 104 lb (47.2 kg)     ECOG FS:3 - Symptomatic, >50% confined to bed GENERAL: Patient is a tired older woman sitting in wheelchair HEENT:  Sclerae anicteric. One small ulceration on right lateral tongue. No evidence of oropharyngeal candidiasis. Neck is supple.  NODES:  No cervical, supraclavicular, or axillary lymphadenopathy palpated.  LUNGS:  Clear to auscultation bilaterally.  No wheezes or rhonchi. HEART:  Regular rate and rhythm. +  murmur. ABDOMEN:  Soft, nontender.  Positive, normoactive bowel sounds. No organomegaly palpated. MSK:  No focal spinal tenderness to palpation. Full range of motion bilaterally in the upper extremities. EXTREMITIES:  No peripheral edema.   SKIN:  Clear with no obvious rashes or skin changes. No nail dyscrasia. Skin splitting in fingertips NEURO:  Nonfocal. Well oriented.  Appropriate  affect.     LAB RESULTS:  CMP     Component Value Date/Time   NA 139 04/29/2017 1542   K 4.0 04/29/2017 1542   CL 102 04/29/2017 1542   CO2 28 04/29/2017 1542   GLUCOSE 110 04/29/2017 1542   BUN 15 04/29/2017 1542   CREATININE 0.66 04/29/2017 1542   CALCIUM 8.6 04/29/2017 1542   PROT 6.2 (L) 04/29/2017 1542  ALBUMIN 3.3 (L) 04/29/2017 1542   AST 14 04/29/2017 1542   ALT 6 04/29/2017 1542   ALKPHOS 64 04/29/2017 1542   BILITOT 0.3 04/29/2017 1542   GFRNONAA >60 04/29/2017 1542   GFRAA >60 04/29/2017 1542    No results found for: TOTALPROTELP, ALBUMINELP, A1GS, A2GS, BETS, BETA2SER, GAMS, MSPIKE, SPEI  No results found for: KPAFRELGTCHN, LAMBDASER, KAPLAMBRATIO  Lab Results  Component Value Date   WBC 11.1 (H) 04/29/2017   NEUTROABS 9.2 (H) 04/29/2017   HGB 5.7 (LL) 04/29/2017   HCT 20.3 (L) 04/29/2017   MCV 74.6 (L) 04/29/2017   PLT 443 (H) 04/29/2017      Chemistry      Component Value Date/Time   NA 139 04/29/2017 1542   K 4.0 04/29/2017 1542   CL 102 04/29/2017 1542   CO2 28 04/29/2017 1542   BUN 15 04/29/2017 1542   CREATININE 0.66 04/29/2017 1542      Component Value Date/Time   CALCIUM 8.6 04/29/2017 1542   ALKPHOS 64 04/29/2017 1542   AST 14 04/29/2017 1542   ALT 6 04/29/2017 1542   BILITOT 0.3 04/29/2017 1542       No results found for: LABCA2  No components found for: CBJSEG315  No results for input(s): INR in the last 168 hours.  No results found for: LABCA2  No results found for: VVO160  No results found for: VPX106  No results found for: YIR485  No results found for: CA2729  No components found for: HGQUANT  No results found for: CEA1 / No results found for: CEA1   No results found for: AFPTUMOR  No results found for: CHROMOGRNA  No results found for: Sylvarena Visit on 04/29/2017  Component Date Value Ref Range Status  . Smear Review 04/29/2017 SMEAR STAINED AND AVAILABLE FOR REVIEW   Final   Performed at [facility], [address] [place], [place] [address]  Appointment on 04/29/2017  Component Date Value Ref Range Status  . WBC 04/29/2017 11.1* 3.9 - 10.3 K/uL Final  . RBC 04/29/2017 2.72* 3.70 - 5.45 MIL/uL Final  . Hemoglobin 04/29/2017 5.7* 11.6 - 15.9 g/dL Final   CRITICAL RESULT CALLED TO, READ BACK BY AND VERIFIED WITH: Tiffany, RN   . HCT 04/29/2017 20.3* 34.8 - 46.6 % Final  . MCV 04/29/2017 74.6* 79.5 - 101.0 fL Final  . MCH 04/29/2017 21.0* 25.1 - 34.0 pg Final  . MCHC 04/29/2017 28.1* 31.5 - 36.0 g/dL Final  . RDW 04/29/2017 16.7* 11.2 - 14.5 % Final  . Platelets 04/29/2017 443* 145 - 400 K/uL Final  . Neutrophils Relative % 04/29/2017 82  % Final  . Neutro Abs 04/29/2017 9.2* 1.5 - 6.5 K/uL Final  . Lymphocytes Relative 04/29/2017 9  % Final  . Lymphs Abs 04/29/2017 1.0  0.9 - 3.3 K/uL Final  . Monocytes Relative 04/29/2017 8  % Final  . Monocytes Absolute 04/29/2017 0.8  0.1 - 0.9 K/uL Final  . Eosinophils Relative 04/29/2017 1  % Final  . Eosinophils Absolute 04/29/2017 0.1  0.0 - 0.5 K/uL Final  . Basophils Relative 04/29/2017 0  % Final  . Basophils Absolute 04/29/2017 0.0  0.0 - 0.1 K/uL Final   Performed at [facility], [place] [address], [place], [place] [address]  . Retic Ct Pct 04/29/2017 3.6* 0.7 - 2.1 % Final  . RBC. 04/29/2017 2.72* 3.70 - 5.45 MIL/uL Final  . Retic Count, Absolute 04/29/2017 97.9*  33.7 - 90.7 K/uL Final   Performed at [facility], [place] [address], [place], [place] [address]  . Vitamin B-12 04/29/2017 410  180 - 914 pg/mL Final   Comment: (NOTE) This assay is not validated for testing neonatal or myeloproliferative syndrome specimens for Vitamin B12 levels. Performed at [facility], [place] [address] [place], [place] [address]   . Folate 04/29/2017 7.8  >5.9 ng/mL Final   Performed at [facility], [place] [address], [place], [place] [address]  . LDH 04/29/2017  233  125 - 245 U/L Final   Performed at [facility], [place] [address], [place], [place] [address]  . DAT, complement 04/29/2017 NEG   Final  . DAT, IgG 04/29/2017    Final                   Value:NEG Performed at [facility], [place] [address] [place], [place] [address]   . Sodium 04/29/2017 139  136 - 145 mmol/L Final  . Potassium 04/29/2017 4.0  3.5 - 5.1 mmol/L Final  . Chloride 04/29/2017 102  98 - 109 mmol/L Final  . CO2 04/29/2017 28  22 - 29 mmol/L Final  . Glucose, Bld 04/29/2017 110  70 - 140 mg/dL Final  . BUN 04/29/2017 15  7 - 26 mg/dL Final  . Creatinine, Ser 04/29/2017 0.66  0.60 - 1.10 mg/dL Final  . Calcium 04/29/2017 8.6  8.4 - 10.4 mg/dL Final  . Total Protein 04/29/2017 6.2* 6.4 - 8.3 g/dL Final  . Albumin 04/29/2017 3.3* 3.5 - 5.0 g/dL Final  . AST 04/29/2017 14  5 - 34 U/L Final  . ALT 04/29/2017 6  0 - 55 U/L Final  . Alkaline Phosphatase 04/29/2017 64  40 - 150 U/L Final  . Total Bilirubin 04/29/2017 0.3  0.2 - 1.2 mg/dL Final  . GFR calc non Af Amer 04/29/2017 >60  >60 mL/min Final  . GFR calc Af Amer 04/29/2017 >60  >60 mL/min Final   Comment: (NOTE) The eGFR has been calculated using the CKD EPI equation. This calculation has not been validated in all clinical situations. eGFR's persistently <60 mL/min signify possible Chronic Kidney Disease.   Georgiann Hahn gap 04/29/2017 9  3 - 11 Final   Performed at [facility], [place] [address], [place], [place] [address]  . Blood Bank Specimen 04/29/2017 ORDER PROCESSED BY BLOOD BANK   Final  . Sample Expiration 04/29/2017    Final                   Value:05/02/2017 Performed at [facility], [place] [address], [place],  [address]   Orders Only on 04/29/2017  Component Date Value Ref Range Status  . ABO/RH(D) 04/29/2017 A POS   Final  . Antibody Screen 04/29/2017 NEG   Final  . Sample Expiration 04/29/2017 05/02/2017   Final  .  Antibody Identification 91/79/1505 NON SPECIFIC COLD ANTIBODY   Final  . Unit Number 04/29/2017 [id number]   Final  . Blood Component Type 04/29/2017 RED CELLS,LR   Final  . Unit division 04/29/2017 00   Final  . Status of Unit 04/29/2017 ALLOCATED   Final  . Transfusion Status 04/29/2017 OK TO TRANSFUSE   Final  . Crossmatch Result 04/29/2017 COMPATIBLE   Final  . Unit Number 04/29/2017 [id number]   Final  . Blood Component Type 04/29/2017 RED CELLS,LR   Final  .  Unit division 04/29/2017 00   Final  . Status of Unit 04/29/2017 ALLOCATED   Final  . Transfusion Status 04/29/2017 OK TO TRANSFUSE   Final  . Crossmatch Result 04/29/2017 COMPATIBLE   Final  . Blood Product Unit Number 04/29/2017 [id number]   Final  . Unit Type and Rh 04/29/2017 6200   Final  . Blood Product Expiration Date 04/29/2017 [id number]   Final  . Blood Product Unit Number 04/29/2017 [id number]   Final  . Unit Type and Rh 04/29/2017 6200   Final  . Blood Product Expiration Date 04/29/2017 [id number]   Final    (this displays the last labs from the last 3 days)  No results found for: TOTALPROTELP, ALBUMINELP, A1GS, A2GS, BETS, BETA2SER, GAMS, MSPIKE, SPEI (this displays SPEP labs)  No results found for: KPAFRELGTCHN, LAMBDASER, KAPLAMBRATIO (kappa/lambda light chains)  No results found for: HGBA, HGBA2QUANT, HGBFQUANT, HGBSQUAN (Hemoglobinopathy evaluation)   Lab Results  Component Value Date   LDH 233 04/29/2017    No results found for: IRON, TIBC, IRONPCTSAT (Iron and TIBC)  No results found for: FERRITIN  Urinalysis No results found for: COLORURINE, APPEARANCEUR, LABSPEC, PHURINE, GLUCOSEU, HGBUR, BILIRUBINUR, KETONESUR, PROTEINUR, UROBILINOGEN, NITRITE, LEUKOCYTESUR   STUDIES: No results found.    ASSESSMENT: 84 y.o. woman with h/o a fib, PAD, here for evaluation of anemia of unknown etiology.  PLAN:  Cassadee and I reviewed her labs and the fact that she is anemic.  She needs  to get a blood transfusion.  She is exhausted and ready to receive the blood.  I reviewed with her the process of receiving blood tomorrow.  I reviewed possible side effects.  She and her family will meet with Dr. Jana Hakim in the morning.  At that time there will be more results back for him to review with her.  I also showed the patient around to the treatment area so their family would know where to go.  Dr. Jana Hakim, who was not in the office at the time of the visit, was available by phone.  I added on a blood smear and let Dr. Jana Hakim know it would be in the lab for him to review.  He will see the patient in the morning and addend this note/encounter with updates.    Teletha has a good understanding of the overall plan. She agrees with it. She will call with any problems that may develop before her next visit here.  I encouraged her family to bring her to the Emergency Room if her symptoms worsen in any way, shape, or form.    Chauncey Cruel, MD   04/30/2017 8:21 AM Medical Oncology and Hematology [facility] [address] [place], [place] [address] Tel. [phone]    Fax. [phone]   ADDENDUM: 84 year old [place] resident complaining of weakness and confusion and found to have a hemoglobin of 5.6 with an MCV of 78 by her primary care physician Dr. Nyoka Cowden when he saw her on 04/26/2017.  The white cell count was 13.9 and the platelet count 509,000.  Note that the patient had a hemoglobin of 13.1 and an MCV of 92.9 three years ago, with a platelet count of 242,000.    Labs obtained here yesterday showed the hemoglobin to be 5.7, MCV 74.6 and reticulocyte count 97.9.Marland Kitchen  The white cell count was 11.1, with an absolute neutrophil count of 9.2, and a platelet count was 443,000.  There is no evidence of hemolysis, with a negative DAT for  antibody and complement and a normal LDH at 233, as well as a normal total bilirubin at 0.3.  Type and screen shows a nonspecific cold antibody.   Folate was normal at 7.8 and B12 also normal at 410.  Unfortunately I do not have access to the blood film this morning as the lab is locked.  Also the ferritin is pending.  The overall picture is most consistent with iron deficiency anemia.  The patient has a history of long-standing internal hemorrhoids and frequently sees a little bit of bright red blood on the tissue.  She also has had a nonhealing ulcer in the left lower leg following Mohs surgery.  There has been no other source of overt bleeding and specifically no epistaxis, no easy bruising, no melena, and no hematochezia.  She has not noted any blood in her urine.  We are proceeding with transfusion of 2 units of packed red cells today, and she will receive Lasix after the first unit.  We are going to set her up for Feraheme next week and the following week.  She will then see me in approximately 6 weeks to make sure the problem has resolved.  I anticipate she can return to her primary care physician for further follow-up after that visit  I met with the patient and her daughter and son-in-law and discussed their questions and concerns.     I personally saw this patient and performed a substantive portion of this encounter with the listed APP documented above.   Chauncey Cruel, MD Medical Oncology and Hematology [facility] [address] [place], [place] [address] Tel. [phone]    Fax. [phone]

## 2017-04-30 NOTE — Patient Instructions (Signed)
Blood Transfusion A blood transfusion is a procedure in which you are given blood through an IV tube. You may need this procedure because of:  Illness.  Surgery.  Injury.  The blood may come from someone else (a donor). You may also be able to donate blood for yourself (autologous blood donation). The blood given in a transfusion is made up of different types of cells. You may get:  Red blood cells. These carry oxygen to the cells in the body.  White blood cells. These help you fight infections.  Platelets. These help your blood to clot.  Plasma. This is the liquid part of your blood. It helps with fluid imbalances.  If you have a clotting disorder, you may also get other types of blood products. What happens before the procedure?  You will have a blood test to find out your blood type. The test also finds out what type of blood your body will accept and matches it to the donor type.  If you are going to have a planned surgery, you may be able to donate your own blood. This may be done in case you need a transfusion.  If you have had an allergic reaction to a transfusion in the past, you may be given medicine to help prevent a reaction. This medicine may be given to you by mouth or through an IV.  You will have your temperature, blood pressure, and pulse checked.  Follow instructions from your doctor about what you cannot eat or drink.  Ask your doctor about: ? Changing or stopping your regular medicines. This is important if you take diabetes medicines or blood thinners. ? Taking medicines such as aspirin and ibuprofen. These medicines can thin your blood. Do not take these medicines before your procedure if your doctor tells you not to. What happens during the procedure?  An IV tube will be put into one of your veins.  The bag of donated blood will be attached to your IV tube. Then, the blood will enter through your vein.  Your temperature, blood pressure, and pulse will be  checked regularly during the procedure. This is done to find early signs of a transfusion reaction.  If you have any signs or symptoms of a reaction, your transfusion will be stopped. You may also be given medicine.  When the transfusion is done, your IV tube will be taken out.  Pressure may be applied to the IV site for a few minutes.  A bandage (dressing) will be put on the IV site. The procedure may vary among doctors and hospitals. What happens after the procedure?  Your temperature, blood pressure, heart rate, breathing rate, and blood oxygen level will be checked often.  Your blood may be tested to see how you are responding to the transfusion.  You may be warmed with fluids or blankets. This is done to keep the temperature of your body normal. Summary  A blood transfusion is a procedure in which you are given blood through an IV tube.  The blood may come from someone else (a donor). You may also be able to donate blood for yourself.  If you have had an allergic reaction to a transfusion in the past, you may be given medicine to help prevent a reaction. This medicine may be given to you by mouth or through an IV tube.  Your temperature, blood pressure, heart rate, breathing rate, and blood oxygen level will be checked often.  Your blood may be tested to   see how you are responding to the transfusion. This information is not intended to replace advice given to you by your health care provider. Make sure you discuss any questions you have with your health care provider. Document Released: 06/04/2008 Document Revised: 10/31/2015 Document Reviewed: 10/31/2015 Elsevier Interactive Patient Education  2017 Elsevier Inc.  

## 2017-05-01 LAB — BPAM RBC
BLOOD PRODUCT EXPIRATION DATE: 201902222359
BLOOD PRODUCT EXPIRATION DATE: 201902222359
ISSUE DATE / TIME: 201902090914
ISSUE DATE / TIME: 201902090914
UNIT TYPE AND RH: 6200
Unit Type and Rh: 6200

## 2017-05-01 LAB — TYPE AND SCREEN
ABO/RH(D): A POS
ANTIBODY SCREEN: NEGATIVE
UNIT DIVISION: 0
Unit division: 0

## 2017-05-02 ENCOUNTER — Other Ambulatory Visit: Payer: Self-pay | Admitting: Oncology

## 2017-05-02 LAB — FERRITIN: Ferritin: 8 ng/mL — ABNORMAL LOW (ref 9–269)

## 2017-05-04 ENCOUNTER — Other Ambulatory Visit: Payer: Self-pay | Admitting: Oncology

## 2017-05-05 ENCOUNTER — Telehealth: Payer: Self-pay | Admitting: Cardiology

## 2017-05-05 ENCOUNTER — Inpatient Hospital Stay: Payer: Medicare Other

## 2017-05-05 VITALS — BP 139/80 | HR 58 | Temp 98.0°F | Resp 16

## 2017-05-05 DIAGNOSIS — D508 Other iron deficiency anemias: Secondary | ICD-10-CM

## 2017-05-05 DIAGNOSIS — D509 Iron deficiency anemia, unspecified: Secondary | ICD-10-CM | POA: Diagnosis not present

## 2017-05-05 DIAGNOSIS — D539 Nutritional anemia, unspecified: Secondary | ICD-10-CM

## 2017-05-05 LAB — COMPREHENSIVE METABOLIC PANEL
ALBUMIN: 3.2 g/dL — AB (ref 3.5–5.0)
ALK PHOS: 59 U/L (ref 40–150)
ALT: 7 U/L (ref 0–55)
AST: 13 U/L (ref 5–34)
Anion gap: 9 (ref 3–11)
BILIRUBIN TOTAL: 0.4 mg/dL (ref 0.2–1.2)
BUN: 18 mg/dL (ref 7–26)
CALCIUM: 8.7 mg/dL (ref 8.4–10.4)
CO2: 27 mmol/L (ref 22–29)
CREATININE: 0.64 mg/dL (ref 0.60–1.10)
Chloride: 104 mmol/L (ref 98–109)
GFR calc Af Amer: >60 mL/min (ref >60)
GLUCOSE: 85 mg/dL (ref 70–140)
POTASSIUM: 3.7 mmol/L (ref 3.5–5.1)
Sodium: 140 mmol/L (ref 136–145)
TOTAL PROTEIN: 5.7 g/dL — AB (ref 6.4–8.3)

## 2017-05-05 LAB — CBC WITH DIFFERENTIAL/PLATELET
BASOS PCT: 0 %
Basophils Absolute: 0 10*3/uL (ref 0.0–0.1)
EOS ABS: 0.1 10*3/uL (ref 0.0–0.5)
EOS PCT: 1 %
HEMATOCRIT: 30.8 % — AB (ref 34.8–46.6)
Hemoglobin: 9.1 g/dL — ABNORMAL LOW (ref 11.6–15.9)
Lymphocytes Relative: 16 %
Lymphs Abs: 1.7 10*3/uL (ref 0.9–3.3)
MCH: 23.8 pg — ABNORMAL LOW (ref 25.1–34.0)
MCHC: 29.5 g/dL — AB (ref 31.5–36.0)
MCV: 80.4 fL (ref 79.5–101.0)
MONO ABS: 1.5 10*3/uL — AB (ref 0.1–0.9)
MONOS PCT: 14 %
NEUTROS ABS: 7.3 10*3/uL — AB (ref 1.5–6.5)
Neutrophils Relative %: 69 %
Platelets: 275 10*3/uL (ref 145–400)
RBC: 3.83 MIL/uL (ref 3.70–5.45)
RDW: 19.3 % — AB (ref 11.2–14.5)
WBC: 10.7 10*3/uL — AB (ref 3.9–10.3)

## 2017-05-05 LAB — RETICULOCYTES
RBC.: 3.83 MIL/uL (ref 3.70–5.45)
RETIC COUNT ABSOLUTE: 126.4 10*3/uL — AB (ref 33.7–90.7)
RETIC CT PCT: 3.3 % — AB (ref 0.7–2.1)

## 2017-05-05 LAB — FERRITIN: Ferritin: 15 ng/mL (ref 9–269)

## 2017-05-05 MED ORDER — SODIUM CHLORIDE 0.9 % IV SOLN
510.0000 mg | Freq: Once | INTRAVENOUS | Status: AC
  Administered 2017-05-05: 510 mg via INTRAVENOUS
  Filled 2017-05-05: qty 17

## 2017-05-05 NOTE — Telephone Encounter (Signed)
Patient daughter calling, states that her mother sees the wound doctor at [facility] and will be referred to a vascular surgeon. Elyse would like to know if Dr. Marlou Porch had any recommendations for a vascular surgeon and states that she trust Dr. Kingsley Plan recommendation.

## 2017-05-05 NOTE — Patient Instructions (Signed)

## 2017-05-05 NOTE — Telephone Encounter (Signed)
Pt's daughter Thad Ranger called wanted to know if Dr Marlou Porch can recommend  a Vascular surgeon for pt. [name] states that pt  Is seen a wound doctor at [facility] and she will be referred to a vascular surgeon. According to daughter pt has a wound in her left leg. The leg  has no blood flow, and pt needs to have her left leg artery open. [name] states that she and pt like  Dr. Marlou Porch and they trust his advice. Daughter is aware that this message will be send to MD for recommendations.

## 2017-05-10 ENCOUNTER — Encounter: Payer: Self-pay | Admitting: Vascular Surgery

## 2017-05-10 ENCOUNTER — Ambulatory Visit (INDEPENDENT_AMBULATORY_CARE_PROVIDER_SITE_OTHER): Payer: Medicare Other | Admitting: Vascular Surgery

## 2017-05-10 VITALS — BP 131/71 | HR 61 | Temp 97.2°F | Resp 18 | Ht <= 58 in | Wt 99.0 lb

## 2017-05-10 DIAGNOSIS — I739 Peripheral vascular disease, unspecified: Secondary | ICD-10-CM | POA: Diagnosis not present

## 2017-05-10 NOTE — Progress Notes (Signed)
[facility] [place]  Patient name: [name] MRN: [id number] DOB: 1923-01-30 Sex: female  REASON FOR VISIT: Discussed nonhealing wound on her left pretibial area  HPI: [name] is a 84 y.o. female here today for discussion of nonhealing wound of the anterior tibial area.  She has known mixed arterial and venous insufficiency in her lower she has had difficulty in the past with venous ulcerations and up to this point has been able to heal these with conservative treatment she recently had progressive changes on her left pretibial wound.  She is seen today for discussion of this.  She has an extensive past history of peripheral vascular occlusive disease.  She had undergone prior left lower extremity peripheral stenting in [place] years ago and reported that she had re-intervention approximately every other year.  She does have a severe coronary disease and has been told on multiple occasions that she cannot have general anesthesia.  She did have a skin cancer on the pretibial area and underwent resection of this and is now had progressive tissue loss and now has exposed tendon which is desiccated and necrotic.  She has severe pain associated with this as well.  Past Medical History:  Diagnosis Date  . Atrial fibrillation ([place])   . Colon polyps   . Coronary artery disease   . DJD (degenerative joint disease)   . DJD (degenerative joint disease)   . Hyperlipidemia   . Kidney stone   . Osteoporosis   . PUD (peptic ulcer disease)   . Thoracic aortic aneurysm ([place])   . Thyroid disease    Hypothyroidism    Family History  Problem Relation Age of Onset  . Heart disease Mother   . Hypertension Mother   . Dementia Mother   . Heart disease Father   . Hypertension Father        Before age 67  . Heart attack Father        X ' s   2-3  . Heart disease Brother   . Hypertension Brother   . Diabetes Daughter   . Stroke Neg Hx       SOCIAL HISTORY: Social History   Tobacco Use  . Smoking status: Never Smoker  . Smokeless tobacco: Never Used  Substance Use Topics  . Alcohol use: No    Allergies  Allergen Reactions  . Ciprofloxacin Diarrhea  . Shrimp [Shellfish Allergy] Hives  . Sulfa Antibiotics Rash    AND HIVES  . Sulfasalazine Rash    AND HIVES AND HIVES    Current Outpatient Medications  Medication Sig Dispense Refill  . amoxicillin (AMOXIL) 500 MG capsule Take 4 capsules by mouth as needed for painful procedure/intervention.    Marland Kitchen amoxicillin-clavulanate (AUGMENTIN) 875-125 MG tablet TAKE 1 TABLET BY MOUTH 2 TIMES A DAY FOR 14 DAYS  0  . aspirin 81 MG tablet Take 81 mg by mouth every other day.    . Hydrocodone-Acetaminophen 5-300 MG TABS Take 5-300 mg by mouth 3 (three) times daily.     Marland Kitchen levothyroxine (SYNTHROID, LEVOTHROID) 88 MCG tablet Take 88 mcg by mouth daily.    . predniSONE (DELTASONE) 5 MG tablet Take 5 mg by mouth daily with breakfast.     . sotalol (BETAPACE) 80 MG tablet Take 40 mg by mouth 2 (two) times daily.    . temazepam (RESTORIL) 15 MG capsule Take 15 mg by mouth at bedtime.    Marland Kitchen warfarin (COUMADIN) 2.5 MG tablet Take  2.5 mg by mouth See admin instructions. Take 2.5 by mouth everyday     No current facility-administered medications for this visit.     REVIEW OF SYSTEMS:  [X]  denotes positive finding, [ ]  denotes negative finding Cardiac  Comments:  Chest pain or chest pressure:    Shortness of breath upon exertion: x   Short of breath when lying flat: x   Irregular heart rhythm:        Vascular    Pain in calf, thigh, or hip brought on by ambulation:    Pain in feet at night that wakes you up from your sleep:     Blood clot in your veins:    Leg swelling:  x         PHYSICAL EXAM: Vitals:   05/10/17 1123  BP: 131/71  Pulse: 61  Resp: 18  Temp: (!) 97.2 F (36.2 C)  SpO2: 100%  Weight: 99 lb (44.9 kg)  Height: 4\' 8"  (1.422 m)    GENERAL: The patient is  a well-nourished female, in no acute distress. The vital signs are documented above. CARDIOVASCULAR: Palpable femoral pulses bilaterally.  No popliteal or distal pulses PULMONARY: There is good air exchange  MUSCULOSKELETAL: There are no major deformities or cyanosis. NEUROLOGIC: No focal weakness or paresthesias are detected. SKIN: Extensive skin necrosis and necrotic tendon on the pretibial area PSYCHIATRIC: The patient has a normal affect.  DATA:  Vascular load studies from outlying wound center from 03/03/2018 reveal dampened monophasic flow at the tibial vessels bilaterally.  Duplex from our office approximately a year ago showed SFA occlusive disease  MEDICAL ISSUES: Had an extremely long discussion with the patient and 2 daughters present.  She clearly does not have adequate arterial flow to heal this necrotic wound.  My concern is that even if we could obtain normal arterial flow she may not have enough ability to heal this extensive necrosis.  I did explain that she would not be able to heal a below-knee amputation since this is essentially the level of the necrotic debris.  Also explained that her only option would be above-knee amputation.  She is not consider this.  Explained that we could proceed with arteriography and potential endovascular treatment of her left SFA occlusive disease but even if we could normalize flow she would be at high risk for limb loss due to the extensive nature of the necrosis in her left pretibial area.  She is a quite alert and oriented and fully understands the.  She and her family will discuss this and notify us how they would like to proceed    Rosetta Posner, MD [facility] of [facility] Tel [phone] Pager [phone]

## 2017-05-11 NOTE — Telephone Encounter (Signed)
Pt was seen and evaluated by Dr Sherren Mocha Early 05/10/2017.

## 2017-05-12 ENCOUNTER — Inpatient Hospital Stay: Payer: Medicare Other

## 2017-05-12 ENCOUNTER — Telehealth: Payer: Self-pay | Admitting: Cardiology

## 2017-05-12 ENCOUNTER — Telehealth: Payer: Self-pay | Admitting: Oncology

## 2017-05-12 VITALS — BP 134/74 | HR 60 | Temp 98.0°F | Resp 17

## 2017-05-12 DIAGNOSIS — D539 Nutritional anemia, unspecified: Secondary | ICD-10-CM

## 2017-05-12 DIAGNOSIS — D508 Other iron deficiency anemias: Secondary | ICD-10-CM

## 2017-05-12 DIAGNOSIS — D509 Iron deficiency anemia, unspecified: Secondary | ICD-10-CM | POA: Diagnosis not present

## 2017-05-12 LAB — FERRITIN: Ferritin: 528 ng/mL — ABNORMAL HIGH (ref 9–269)

## 2017-05-12 LAB — CBC WITH DIFFERENTIAL/PLATELET
Basophils Absolute: 0 10*3/uL (ref 0.0–0.1)
Basophils Relative: 0 %
Eosinophils Absolute: 0.2 10*3/uL (ref 0.0–0.5)
Eosinophils Relative: 1 %
HEMATOCRIT: 35.7 % (ref 34.8–46.6)
Hemoglobin: 10.7 g/dL — ABNORMAL LOW (ref 11.6–15.9)
LYMPHS ABS: 1.3 10*3/uL (ref 0.9–3.3)
LYMPHS PCT: 11 %
MCH: 24.8 pg — ABNORMAL LOW (ref 25.1–34.0)
MCHC: 30 g/dL — ABNORMAL LOW (ref 31.5–36.0)
MCV: 82.8 fL (ref 79.5–101.0)
Monocytes Absolute: 1.3 10*3/uL — ABNORMAL HIGH (ref 0.1–0.9)
Monocytes Relative: 12 %
NEUTROS PCT: 76 %
NRBC: 0 /100{WBCs}
Neutro Abs: 8.9 10*3/uL — ABNORMAL HIGH (ref 1.5–6.5)
PLATELETS: 303 10*3/uL (ref 145–400)
RBC: 4.31 MIL/uL (ref 3.70–5.45)
RDW: 22 % — ABNORMAL HIGH (ref 11.2–14.5)
WBC: 11.7 10*3/uL — AB (ref 3.9–10.3)

## 2017-05-12 LAB — COMPREHENSIVE METABOLIC PANEL
ALBUMIN: 3.3 g/dL — AB (ref 3.5–5.0)
ALT: 12 U/L (ref 0–55)
AST: 15 U/L (ref 5–34)
Alkaline Phosphatase: 67 U/L (ref 40–150)
Anion gap: 8 (ref 3–11)
BILIRUBIN TOTAL: 0.3 mg/dL (ref 0.2–1.2)
BUN: 17 mg/dL (ref 7–26)
CHLORIDE: 103 mmol/L (ref 98–109)
CO2: 28 mmol/L (ref 22–29)
CREATININE: 0.64 mg/dL (ref 0.60–1.10)
Calcium: 9.3 mg/dL (ref 8.4–10.4)
GFR calc Af Amer: >60 mL/min (ref >60)
GLUCOSE: 100 mg/dL (ref 70–140)
Potassium: 4 mmol/L (ref 3.5–5.1)
Sodium: 139 mmol/L (ref 136–145)
Total Protein: 6.2 g/dL — ABNORMAL LOW (ref 6.4–8.3)

## 2017-05-12 LAB — RETICULOCYTES
RBC.: 4.31 MIL/uL (ref 3.70–5.45)
Retic Count, Absolute: 185.3 10*3/uL — ABNORMAL HIGH (ref 33.7–90.7)
Retic Ct Pct: 4.3 % — ABNORMAL HIGH (ref 0.7–2.1)

## 2017-05-12 MED ORDER — SODIUM CHLORIDE 0.9 % IV SOLN
510.0000 mg | Freq: Once | INTRAVENOUS | Status: AC
  Administered 2017-05-12: 510 mg via INTRAVENOUS
  Filled 2017-05-12: qty 17

## 2017-05-12 NOTE — Telephone Encounter (Signed)
New message     Per daughter she has a severe wound , she has a tendon that is dying, and they have to decide if they should amputate her leg or put her in hospice.    The daughter wants your opinion on what they should do should they proceed with have leg amputated or hospice

## 2017-05-12 NOTE — Telephone Encounter (Signed)
Dr Marlou Porch called and discussed information with daughter.  She will continue to discuss with patient (who is in a lot of pain) and they will make a decision together based on patient's wishes.  DAughter will c/b if further questions and/or concerns.

## 2017-05-12 NOTE — Telephone Encounter (Signed)
Will forward to Dr Skains for review °

## 2017-05-12 NOTE — Patient Instructions (Signed)

## 2017-05-12 NOTE — Telephone Encounter (Signed)
Mailed patient calendar of upcoming May appointments.  °

## 2017-05-23 MED FILL — WARFARIN SODIUM 2.5 MG TAB: 2.5 | 90 days supply | Qty: 120 | Fill #3

## 2017-05-23 MED FILL — TEMAZEPAM 15 MG CAPSULE: 15 | 90 days supply | Qty: 90 | Fill #1

## 2017-05-24 MED FILL — HYDROCODON-APAP 5-325: 5-325 | 30 days supply | Qty: 120 | Fill #0

## 2017-05-30 IMAGING — DX DG THORACIC SPINE 2V
2 series · 2 of 2 positions shown · non-contrast
Comparison: Chest radiograph 01/03/2014 and chest CT 10/08/2013

CLINICAL DATA: Pain in the upper back for 2 days. Prior back
surgery.

EXAM:
THORACIC SPINE 2 VIEWS

[t-spine ap]
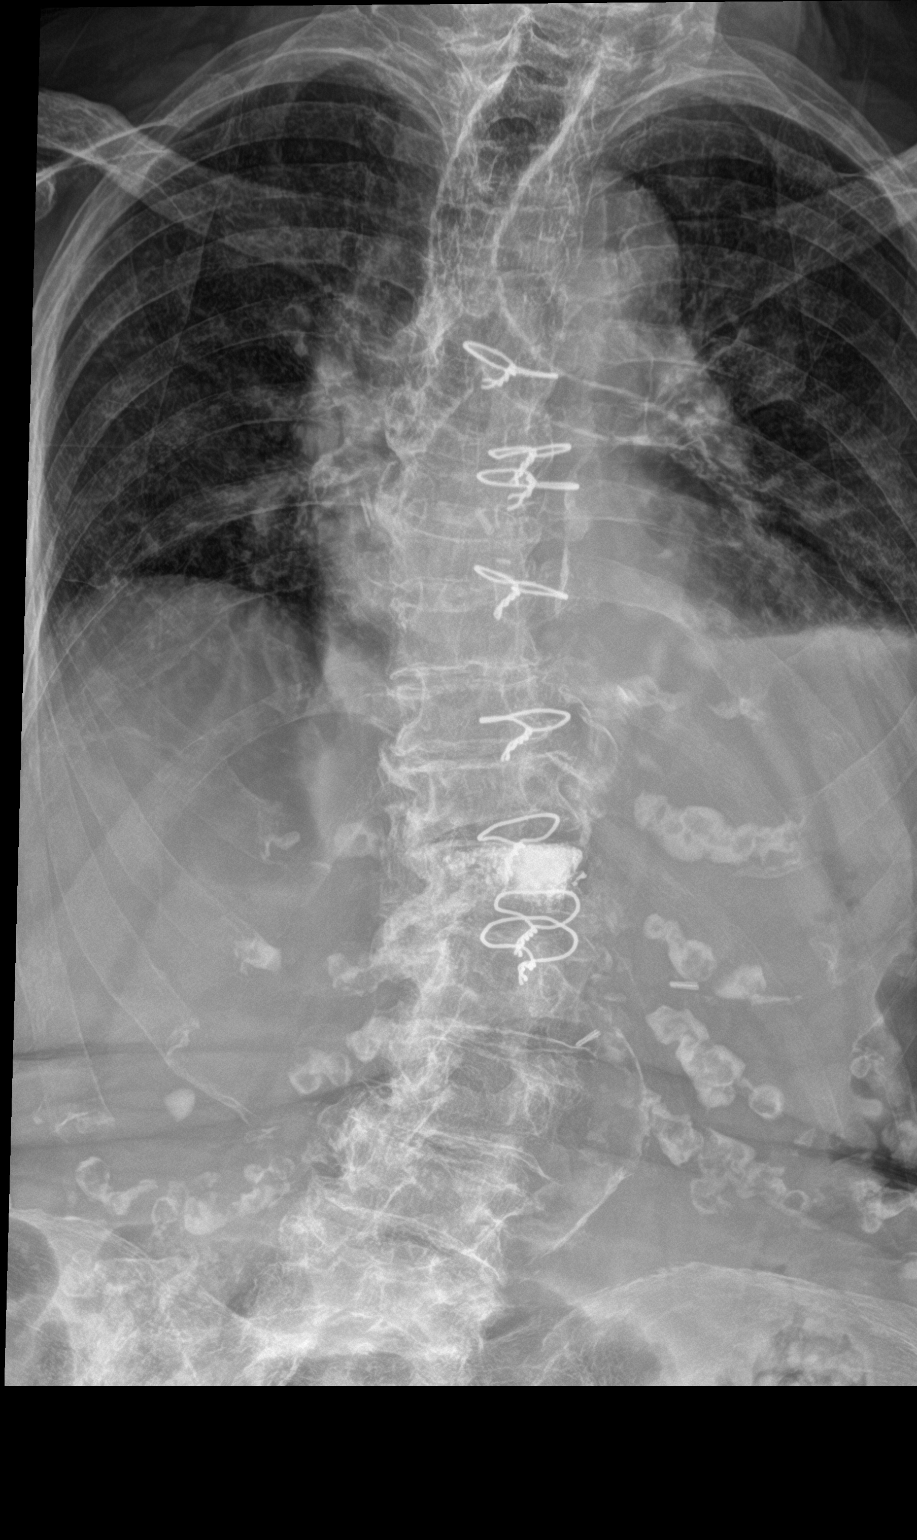

[t-spine lat]
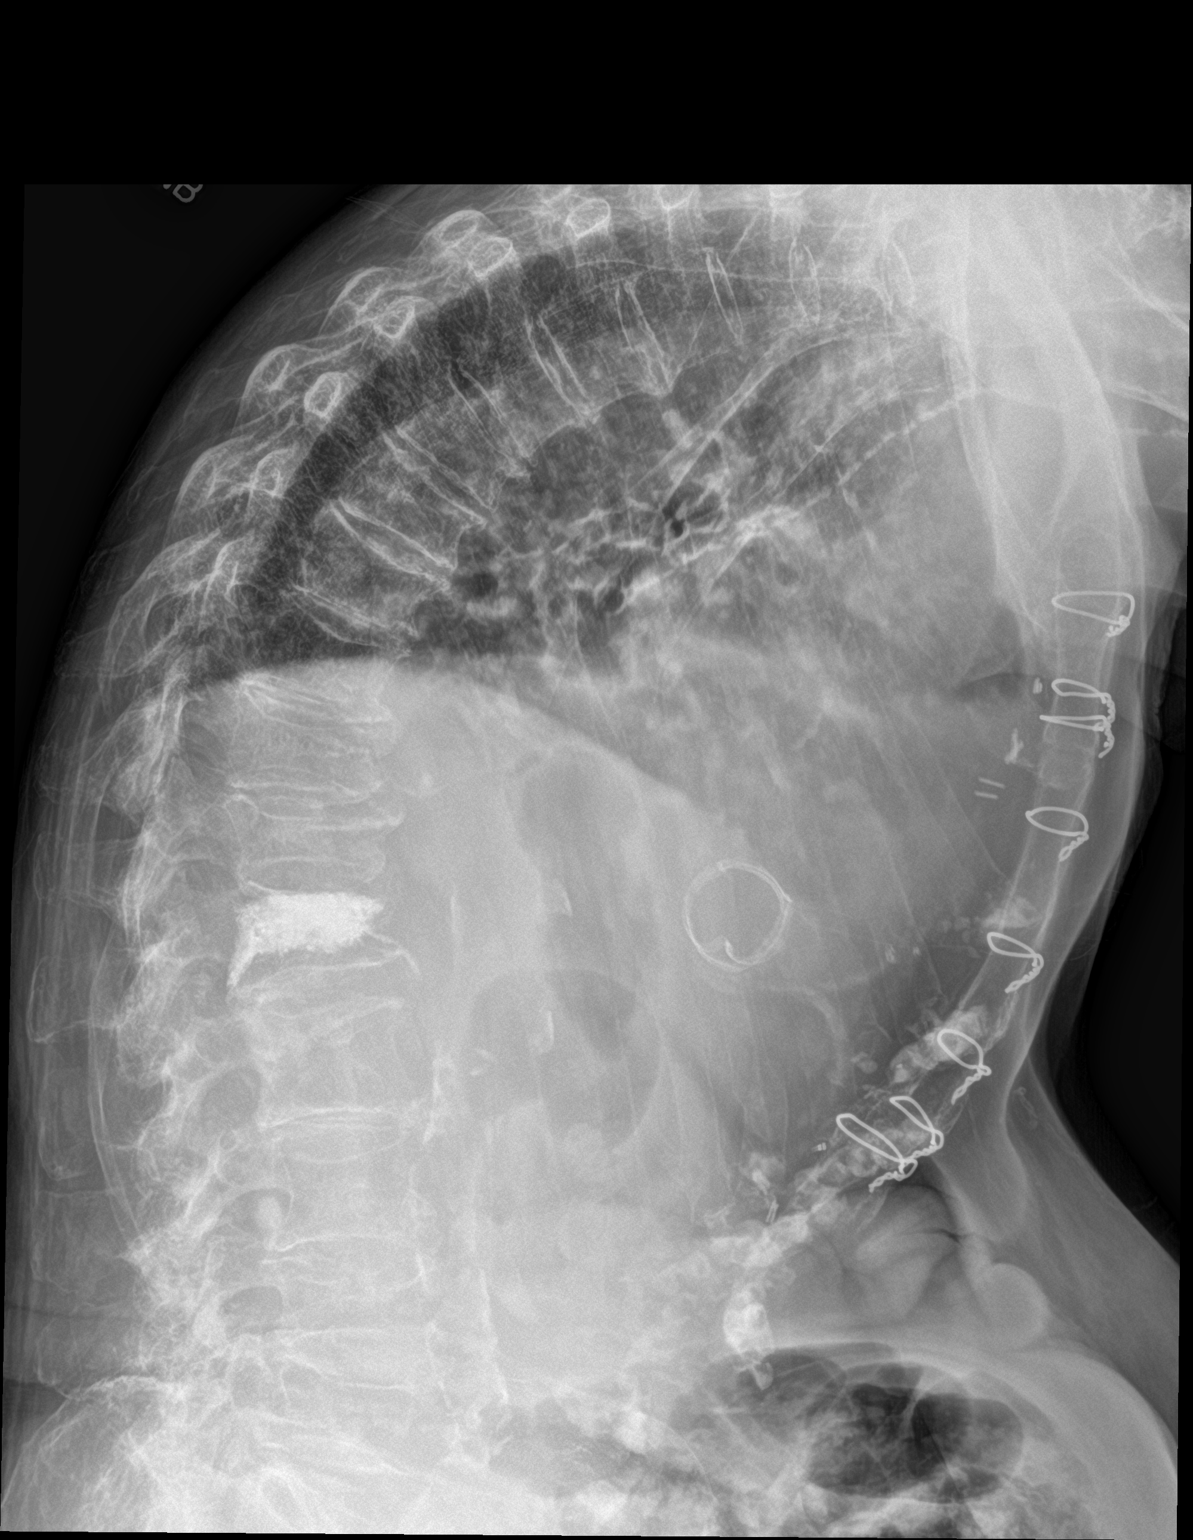

[2 of 2 positions shown; findings below may reference images not displayed]

FINDINGS: The bones are osteopenic. There is S shaped thoracolumbar scoliosis
with greatest curvature to the left, centered at the L2-3 level.
Findings a remote vertebral augmentation at L1. Moderate compression
deformity of the T12 vertebral body, which appears unchanged
compared to the prior chest radiograph. There is multilevel lumbar
and lower thoracic facet arthrosis. No acute fracture is identified.

Incompletely visualized lungs are clear. Findings of prior CABG and
valve replacement are noted.
IMPRESSION: Osteoporosis, scoliosis and advanced lower thoracic and lumbar
degenerative change. Unchanged appearance of chronic compression
fractures at T12 and L1. No acute fracture identified.

## 2017-06-01 IMAGING — DX DG CHEST 2V
2 series · 2 of 2 positions shown · non-contrast
Comparison: 01/03/2014 and earlier.

CLINICAL DATA: [AGE] female with acute onset wheezing last
night. Initial encounter.

EXAM:
CHEST  2 VIEW

[chest pa]
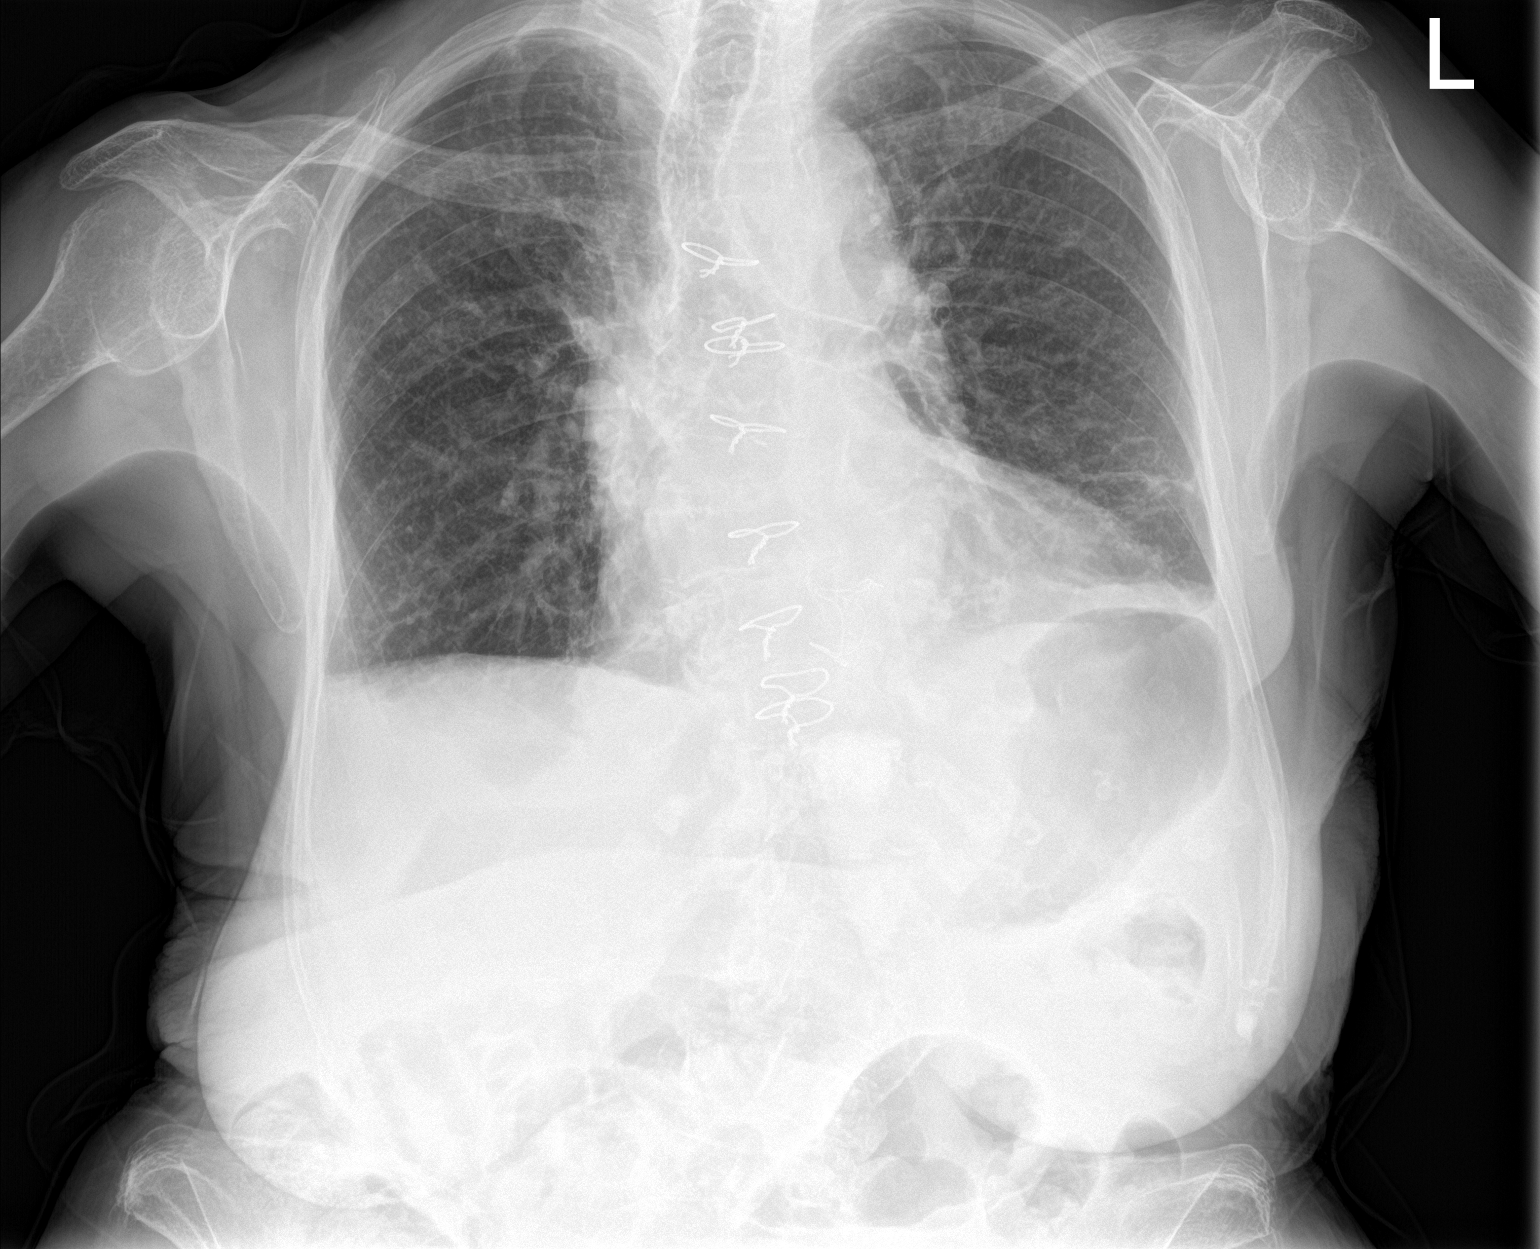

[chest lat]
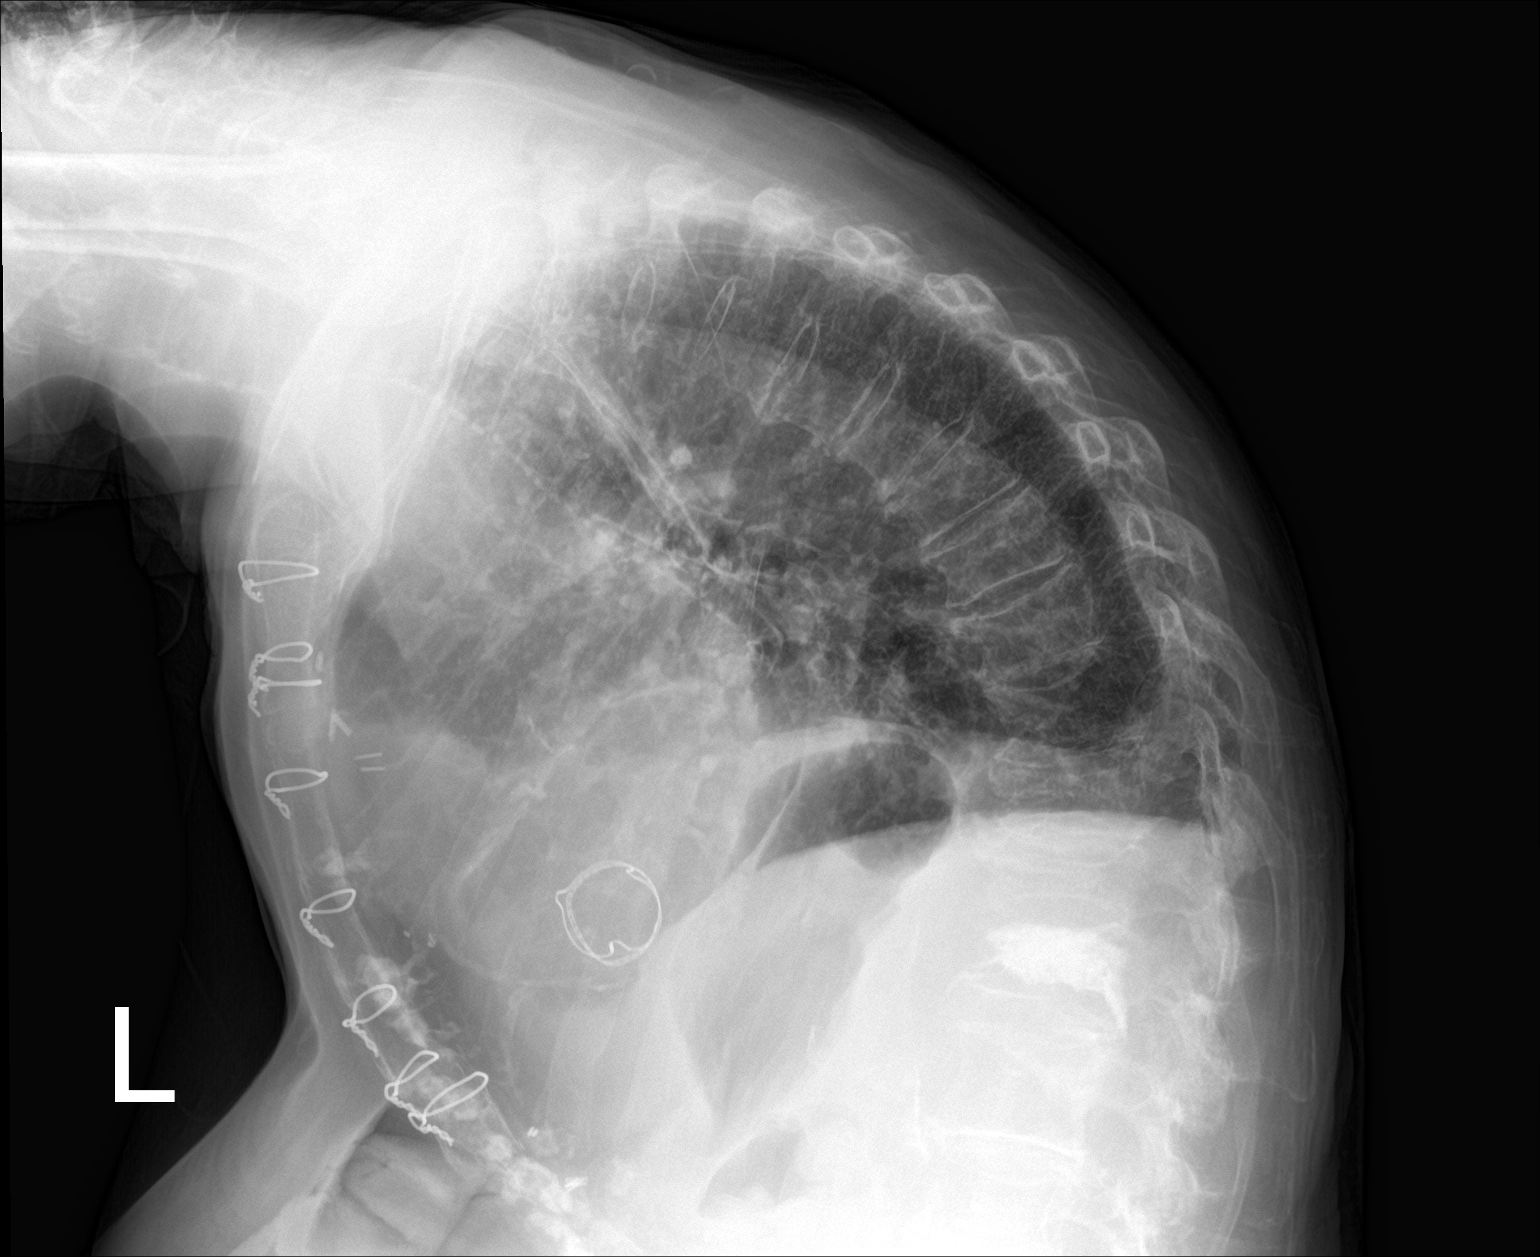

[2 of 2 positions shown; findings below may reference images not displayed]

FINDINGS: Stable sequelae of median sternotomy and cardiac valve replacement.
Stable cardiomegaly and mediastinal contours. Calcified aortic
atherosclerosis. Chronic exaggerated thoracic kyphosis in part
related to multilevel chronic lower thoracic compression fractures.
Osteopenia. No acute osseous abnormality identified. Visualized
tracheal air column is within normal limits. No pneumothorax,
pleural effusion or confluent pulmonary opacity. Increased pulmonary
vascularity but no overt edema.
IMPRESSION: 1. Chronic cardiomegaly. Increased pulmonary vascularity without
overt edema.
2. Chronically exaggerated thoracic kyphosis in part related to
multilevel chronic compression fractures.

## 2017-06-06 ENCOUNTER — Encounter: Payer: Self-pay | Admitting: Oncology

## 2017-06-06 ENCOUNTER — Other Ambulatory Visit: Payer: Self-pay | Admitting: Oncology

## 2017-06-06 NOTE — Progress Notes (Signed)
The patient has accepted hospice services.  I have sent her son-in-law and note saying that we are available as needed.  She does have an appointment here and she is welcome to keep that if that would be helpful

## 2017-06-17 MED FILL — HYDROCODON-APAP 5-325: 5-325 | 5 days supply | Qty: 60 | Fill #0

## 2017-06-24 MED FILL — HYDROCODON-APAP 10-325: 10-325 | 15 days supply | Qty: 60 | Fill #0

## 2017-07-01 MED FILL — oxyCODONE HCL 5 MG TABS: 5 | 5 days supply | Qty: 60 | Fill #0

## 2017-07-01 MED FILL — FUROSEMIDE 20 MG TABS: 20 | 30 days supply | Qty: 30 | Fill #0

## 2017-07-01 MED FILL — SSD 1% CREAM: 1 | 14 days supply | Qty: 50 | Fill #0

## 2017-07-04 MED FILL — LORazepam 0.5 MG TABS: 0.5 | 15 days supply | Qty: 60 | Fill #0

## 2017-07-04 MED FILL — MORPHINE SULF 100 MG/5 ML S: 100 | 10 days supply | Qty: 30 | Fill #0

## 2017-07-18 ENCOUNTER — Other Ambulatory Visit: Payer: Self-pay | Admitting: Oncology

## 2017-07-18 ENCOUNTER — Telehealth: Payer: Self-pay | Admitting: Oncology

## 2017-07-18 NOTE — Progress Notes (Unsigned)
I sent a letter to the patient's daughter and canceled her appointment here.

## 2017-07-18 NOTE — Telephone Encounter (Signed)
Per 4/29 schedule message from GM cancel 5/8 f/u and mark patient as deceased. Patient status updated.

## 2017-07-20 DEATH — deceased

## 2017-07-27 ENCOUNTER — Ambulatory Visit: Payer: Medicare Other | Admitting: Oncology

## 2017-08-03 ENCOUNTER — Ambulatory Visit: Payer: Medicare Other | Admitting: Oncology
